# Patient Record
Sex: Female | Born: 1950 | Race: White | Hispanic: No | Smoking: Never smoker
Health system: Southern US, Community
[De-identification: ages and names within clinical notes are randomized; demographics above are authoritative.]

## PROBLEM LIST (undated history)

## (undated) DIAGNOSIS — G919 Hydrocephalus, unspecified: Secondary | ICD-10-CM

## (undated) DIAGNOSIS — R42 Dizziness and giddiness: Secondary | ICD-10-CM

## (undated) DIAGNOSIS — I10 Essential (primary) hypertension: Secondary | ICD-10-CM

## (undated) DIAGNOSIS — R32 Unspecified urinary incontinence: Secondary | ICD-10-CM

## (undated) DIAGNOSIS — F329 Major depressive disorder, single episode, unspecified: Secondary | ICD-10-CM

## (undated) DIAGNOSIS — F32A Depression, unspecified: Secondary | ICD-10-CM

## (undated) DIAGNOSIS — R296 Repeated falls: Secondary | ICD-10-CM

## (undated) DIAGNOSIS — I82409 Acute embolism and thrombosis of unspecified deep veins of unspecified lower extremity: Secondary | ICD-10-CM

## (undated) DIAGNOSIS — H9190 Unspecified hearing loss, unspecified ear: Secondary | ICD-10-CM

## (undated) DIAGNOSIS — R569 Unspecified convulsions: Secondary | ICD-10-CM

## (undated) HISTORY — PX: TUBAL LIGATION: SHX77

## (undated) HISTORY — DX: Dizziness and giddiness: R42

## (undated) HISTORY — PX: OTHER SURGICAL HISTORY: SHX169

## (undated) HISTORY — PX: BRAIN SURGERY: SHX531

## (undated) HISTORY — PX: KNEE SURGERY: SHX244

## (undated) HISTORY — PX: BREAST BIOPSY: SHX20

## (undated) HISTORY — PX: CSF SHUNT: SHX92

## (undated) HISTORY — PX: COCHLEAR IMPLANT: SUR684

---

## 1999-02-02 ENCOUNTER — Emergency Department (HOSPITAL_COMMUNITY): Admission: EM | Admit: 1999-02-02 | Discharge: 1999-02-02 | Payer: Self-pay | Admitting: Emergency Medicine

## 1999-02-02 ENCOUNTER — Encounter: Payer: Self-pay | Admitting: Emergency Medicine

## 1999-02-11 ENCOUNTER — Encounter: Admission: RE | Admit: 1999-02-11 | Discharge: 1999-02-11 | Payer: Self-pay | Admitting: *Deleted

## 1999-02-14 ENCOUNTER — Other Ambulatory Visit: Admission: RE | Admit: 1999-02-14 | Discharge: 1999-02-14 | Payer: Self-pay | Admitting: Internal Medicine

## 2000-04-01 ENCOUNTER — Other Ambulatory Visit: Admission: RE | Admit: 2000-04-01 | Discharge: 2000-04-01 | Payer: Self-pay | Admitting: Obstetrics and Gynecology

## 2000-04-01 ENCOUNTER — Encounter (INDEPENDENT_AMBULATORY_CARE_PROVIDER_SITE_OTHER): Payer: Self-pay | Admitting: Specialist

## 2000-04-13 ENCOUNTER — Encounter: Admission: RE | Admit: 2000-04-13 | Discharge: 2000-04-13 | Payer: Self-pay | Admitting: Neurosurgery

## 2000-04-13 ENCOUNTER — Encounter: Payer: Self-pay | Admitting: Neurosurgery

## 2000-04-14 ENCOUNTER — Ambulatory Visit (HOSPITAL_COMMUNITY): Admission: RE | Admit: 2000-04-14 | Discharge: 2000-04-14 | Payer: Self-pay | Admitting: Obstetrics and Gynecology

## 2000-04-14 ENCOUNTER — Encounter: Payer: Self-pay | Admitting: Obstetrics and Gynecology

## 2000-10-13 ENCOUNTER — Encounter: Payer: Self-pay | Admitting: General Surgery

## 2000-10-14 ENCOUNTER — Encounter (INDEPENDENT_AMBULATORY_CARE_PROVIDER_SITE_OTHER): Payer: Self-pay | Admitting: Specialist

## 2000-10-14 ENCOUNTER — Inpatient Hospital Stay (HOSPITAL_COMMUNITY): Admission: RE | Admit: 2000-10-14 | Discharge: 2000-10-15 | Payer: Self-pay | Admitting: General Surgery

## 2001-07-02 ENCOUNTER — Encounter: Admission: RE | Admit: 2001-07-02 | Discharge: 2001-07-02 | Payer: Self-pay | Admitting: Neurosurgery

## 2001-07-02 ENCOUNTER — Encounter: Payer: Self-pay | Admitting: Neurosurgery

## 2001-12-08 ENCOUNTER — Emergency Department (HOSPITAL_COMMUNITY): Admission: EM | Admit: 2001-12-08 | Discharge: 2001-12-08 | Payer: Self-pay | Admitting: *Deleted

## 2002-02-25 ENCOUNTER — Other Ambulatory Visit: Admission: RE | Admit: 2002-02-25 | Discharge: 2002-02-25 | Payer: Self-pay | Admitting: Obstetrics and Gynecology

## 2005-05-22 ENCOUNTER — Ambulatory Visit: Payer: Self-pay | Admitting: Internal Medicine

## 2005-06-04 ENCOUNTER — Ambulatory Visit: Payer: Self-pay | Admitting: Internal Medicine

## 2005-06-17 ENCOUNTER — Ambulatory Visit: Payer: Self-pay | Admitting: Internal Medicine

## 2005-08-27 ENCOUNTER — Other Ambulatory Visit: Admission: RE | Admit: 2005-08-27 | Discharge: 2005-08-27 | Payer: Self-pay | Admitting: Family Medicine

## 2005-11-09 ENCOUNTER — Emergency Department (HOSPITAL_COMMUNITY): Admission: EM | Admit: 2005-11-09 | Discharge: 2005-11-09 | Payer: Self-pay | Admitting: Emergency Medicine

## 2005-11-17 ENCOUNTER — Ambulatory Visit (HOSPITAL_COMMUNITY): Admission: RE | Admit: 2005-11-17 | Discharge: 2005-11-17 | Payer: Self-pay | Admitting: Obstetrics and Gynecology

## 2005-11-17 ENCOUNTER — Encounter (INDEPENDENT_AMBULATORY_CARE_PROVIDER_SITE_OTHER): Payer: Self-pay | Admitting: *Deleted

## 2005-11-24 ENCOUNTER — Encounter: Admission: RE | Admit: 2005-11-24 | Discharge: 2005-12-26 | Payer: Self-pay | Admitting: Neurosurgery

## 2006-02-11 ENCOUNTER — Encounter: Admission: RE | Admit: 2006-02-11 | Discharge: 2006-05-12 | Payer: Self-pay | Admitting: Neurosurgery

## 2007-09-21 ENCOUNTER — Encounter: Admission: RE | Admit: 2007-09-21 | Discharge: 2007-10-13 | Payer: Self-pay | Admitting: Family Medicine

## 2007-09-28 ENCOUNTER — Other Ambulatory Visit: Admission: RE | Admit: 2007-09-28 | Discharge: 2007-09-28 | Payer: Self-pay | Admitting: Family Medicine

## 2007-11-09 ENCOUNTER — Encounter: Payer: Self-pay | Admitting: Internal Medicine

## 2008-09-20 ENCOUNTER — Encounter: Admission: RE | Admit: 2008-09-20 | Discharge: 2008-11-29 | Payer: Self-pay | Admitting: Neurosurgery

## 2009-03-12 ENCOUNTER — Encounter: Payer: Self-pay | Admitting: Internal Medicine

## 2009-09-15 ENCOUNTER — Inpatient Hospital Stay (HOSPITAL_COMMUNITY): Admission: EM | Admit: 2009-09-15 | Discharge: 2009-09-17 | Payer: Self-pay | Admitting: Emergency Medicine

## 2009-10-16 ENCOUNTER — Encounter (HOSPITAL_BASED_OUTPATIENT_CLINIC_OR_DEPARTMENT_OTHER): Admission: RE | Admit: 2009-10-16 | Discharge: 2009-12-24 | Payer: Self-pay | Admitting: Internal Medicine

## 2009-10-22 ENCOUNTER — Ambulatory Visit: Payer: Self-pay | Admitting: Surgery

## 2009-10-22 ENCOUNTER — Encounter (HOSPITAL_BASED_OUTPATIENT_CLINIC_OR_DEPARTMENT_OTHER): Payer: Self-pay | Admitting: General Surgery

## 2009-10-22 ENCOUNTER — Ambulatory Visit: Admission: RE | Admit: 2009-10-22 | Discharge: 2009-10-22 | Payer: Self-pay | Admitting: General Surgery

## 2010-07-10 ENCOUNTER — Encounter: Admission: RE | Admit: 2010-07-10 | Discharge: 2010-07-10 | Payer: Self-pay | Admitting: Family Medicine

## 2010-07-29 ENCOUNTER — Encounter: Payer: Self-pay | Admitting: Internal Medicine

## 2010-08-12 ENCOUNTER — Emergency Department (HOSPITAL_COMMUNITY): Admission: EM | Admit: 2010-08-12 | Discharge: 2010-08-12 | Payer: Self-pay | Admitting: Emergency Medicine

## 2010-09-23 ENCOUNTER — Encounter: Payer: Self-pay | Admitting: Internal Medicine

## 2010-10-18 NOTE — Letter (Signed)
Summary: Otolaryngology/[facility]  Otolaryngology/[facility]   Imported By: Lester [place] 08/09/2010 11:44:28  _____________________________________________________________________  External Attachment:    Type:   Image     Comment:   External Document

## 2010-10-23 NOTE — Procedures (Signed)
Summary: COLON   Colonoscopy  Procedure date:  06/17/2005  Findings:      Location:  [facility].   Patient Name: Maureen Ortiz, Maureen Ortiz MRN:  Procedure Procedures: Colonoscopy CPT: 04540.  Personnel: Endoscopist: Dora L. Juanda Chance, MD.  Referred By: Laurann Montana, MD.  Exam Location: Exam performed in Outpatient Clinic. Outpatient  Patient Consent: Procedure, Alternatives, Risks and Benefits discussed, consent obtained, from patient. Consent was obtained by the RN.  Indications Symptoms: Constipation Patient has difficulty evacuating, strains with stool passage. Change in bowel habits.  History  Current Medications: Patient is not currently taking Coumadin.  Pre-Exam Physical: Performed Jun 17, 2005. Entire physical exam was normal.  Exam Exam: Extent of exam reached: Cecum, extent intended: Cecum.  The cecum was identified by appendiceal orifice and IC valve. Colon retroflexion performed. Images taken. ASA Classification: I. Tolerance: good.  Monitoring: Pulse and BP monitoring, Oximetry used. Supplemental O2 given.  Colon Prep Used Miralax for colon prep. Prep results: good.  Sedation Meds: Patient assessed and found to be appropriate for moderate (conscious) sedation. Fentanyl 75 mcg. given IV. Versed 7 mg. given IV.  Findings - NORMAL EXAM: Cecum.  - NORMAL EXAM: Rectum. Comments: no evidence of prolapse, no hemorrhoids, structurally normal rectum.   Assessment Normal examination.  Comments: no polyps, no structural abnormALITY OF THE RECTUM Events  Unplanned Interventions: No intervention was required.  Unplanned Events: There were no complications. Plans Patient Education: Patient given standard instructions for: Yearly hemoccult testing recommended. Patient instructed to get routine colonoscopy every 10 years.  Comments: use manual pressure to evacuate stoool, also may use stool softeners, increase exercise to strengthen the pelvic floor,  consider surgical eval for repair of rectocele as a last resort Disposition: After procedure patient sent to recovery. After recovery patient sent home.   This report was created from the original endoscopy report, which was reviewed and signed by the above listed endoscopist.

## 2010-10-31 NOTE — Letter (Signed)
Summary: Cochlear Implant Eval/[facility]  Cochlear Implant [facility]   Imported By: Sherian Rein 10/25/2010 12:19:36  _____________________________________________________________________  External Attachment:    Type:   Image     Comment:   External Document

## 2010-12-01 LAB — BASIC METABOLIC PANEL
BUN: 18 mg/dL (ref 6–23)
GFR calc Af Amer: >60 mL/min (ref >60)
GFR calc non Af Amer: >60 mL/min (ref >60)
Potassium: 3.7 mEq/L (ref 3.5–5.1)
Sodium: 139 mEq/L (ref 135–145)

## 2010-12-01 LAB — CBC
HCT: 36.9 % (ref 36.0–46.0)
Hemoglobin: 12.4 g/dL (ref 12.0–15.0)
MCHC: 33.5 g/dL (ref 30.0–36.0)
MCV: 92.2 fL (ref 78.0–100.0)
Platelets: 231 10*3/uL (ref 150–400)
RBC: 4.01 MIL/uL (ref 3.87–5.11)
WBC: 7.1 10*3/uL (ref 4.0–10.5)

## 2010-12-16 LAB — DIFFERENTIAL
Basophils Relative: 1 % (ref 0–1)
Lymphocytes Relative: 29 % (ref 12–46)
Monocytes Absolute: 0.4 10*3/uL (ref 0.1–1.0)
Monocytes Relative: 6 % (ref 3–12)
Neutro Abs: 3.5 10*3/uL (ref 1.7–7.7)

## 2010-12-16 LAB — CBC
HCT: 38 % (ref 36.0–46.0)
WBC: 5.7 10*3/uL (ref 4.0–10.5)

## 2010-12-16 LAB — BASIC METABOLIC PANEL
BUN: 23 mg/dL (ref 6–23)
GFR calc Af Amer: >60 mL/min (ref >60)
GFR calc non Af Amer: >60 mL/min (ref >60)

## 2011-01-28 ENCOUNTER — Ambulatory Visit: Payer: Medicare Other | Attending: Family Medicine | Admitting: Physical Therapy

## 2011-01-28 DIAGNOSIS — R42 Dizziness and giddiness: Secondary | ICD-10-CM | POA: Insufficient documentation

## 2011-01-28 DIAGNOSIS — R269 Unspecified abnormalities of gait and mobility: Secondary | ICD-10-CM | POA: Insufficient documentation

## 2011-01-28 DIAGNOSIS — IMO0001 Reserved for inherently not codable concepts without codable children: Secondary | ICD-10-CM | POA: Insufficient documentation

## 2011-01-31 NOTE — Op Note (Signed)
Maureen Ortiz, Maureen Ortiz               ACCOUNT NO.:  [id number]   MEDICAL RECORD NO.:  [id number]          PATIENT TYPE:  AMB   LOCATION:  SDC                           FACILITY:  [facility]   PHYSICIAN:  Randye Lobo, M.D.   DATE OF BIRTH:  19-Sep-1950   DATE OF PROCEDURE:  11/17/2005  DATE OF DISCHARGE:                                 OPERATIVE REPORT   PREOPERATIVE DIAGNOSIS:  1.  Postmenopausal bleeding.  2.  Benign endometrial polyp.   POSTOPERATIVE DIAGNOSIS:  1.  Postmenopausal bleeding.  2.  Benign endometrial polyp.   PROCEDURE:  Hysteroscopy, dilation and curettage, endometrial polypectomy.   SURGEON:  Conley Simmonds, M.D.   ANESTHESIA:  MAC, paracervical block with 1% lidocaine.   IV FLUIDS:  1000 mL Ringer's lactate.   ESTIMATED BLOOD LOSS:  Minimal.   URINE OUTPUT:  400 mL by in and out catheterization.   SORBITOL DEFICIT:  50 mL.   COMPLICATIONS:  None.   INDICATIONS FOR PROCEDURE:  The patient is a 60 year old gravida 4 Caucasian  female who presented to her primary care Davontae Prusinski with an episode of  postmenopausal bleeding which occurred in October 2006. The patient had  experienced her last menstrual period in February 2006. An ultrasound was  ordered in December which documented an endometrial stripe of 3 mm along  with a possible fibroid uterus. The ovaries were within normal limits. When  the patient presented for further evaluation and care, an office endometrial  biopsy was performed by me which documented a benign endometrial polyp. Her  last Pap smear was performed in December 2006 and was within normal limits.  A discussion was held with the patient regarding her postmenopausal bleeding  and the benign endometrial polyp and a plan was made to proceed with a  hysteroscopy, D&C along with a polypectomy after risks, benefits, and  alternatives were discussed.   FINDINGS:  Examination under anesthesia revealed a small, mid position,  mobile uterus. No adnexal  masses were appreciated.   Hysteroscopy demonstrated a sessile polyp which measured approximately 1 cm  in diameter and was noted to be at the inferiormost aspect of the posterior  lower uterine segment. There was no evidence of any intracavitary fibroids.  The tubal ostia appeared to be unremarkable. The endometrial lining was  thin. The uterus sounded to 7 cm.   SPECIMENS:  The endometrial polyp and the endometrial curettings were sent  to pathology separately.   PROCEDURE:  The patient was reidentified in the preoperative hold area. She  did receive clindamycin and gentamicin IV for antibiotic prophylaxis. The  patient was brought to the operating room where she was placed in the dorsal  lithotomy position. MAC anesthesia was induced and her vagina and perineum  were then sterilely prepped and draped. She was catheterized of urine. The  examination under anesthesia was performed and the findings were as noted  above. The speculum was placed inside the vagina and a single-tooth  tenaculum was placed on the anterior cervical lip. A paracervical block was  performed in a standard fashion with a total of 10  mL a 1% lidocaine. The  uterus was sounded. The cervix was then sequentially dilated up to a #21  Pratt dilator. The hysteroscope was then inserted through the cervical os  under the continuous infusion of sorbitol solution. The findings were as  noted above. The diagnostic hysteroscope was removed and the serrated  curette was used to remove the sessile endometrial polyp. The specimen was  sent to pathology. The hysteroscope was then reinserted and the uterine  cavity was reevaluated and there was no evidence of any continued polyp  present. The hysteroscope was withdrawn and the remaining quadrants of the  endometrial cavity were then curetted with the serrated curette until we had  a characteristically gritty texture to it. The specimen was sent to  pathology separately.   The  single-tooth tenaculum was removed from the cervix. A ring forceps was  used to create hemostasis along the tenaculum site on the cervix. Hemostasis  was then noted to be good. This concluded the patient's surgery.   The patient was cleansed of any remaining Betadine, awakened, and escorted  to the recovery room in stable and awake condition. There were no  complications to the procedure. All needle, instrument, sponge counts were  correct.      Randye Lobo, M.D.  Electronically Signed     BES/MEDQ  D:  11/17/2005  T:  11/18/2005  Job:  [id number]

## 2011-01-31 NOTE — Op Note (Signed)
[place]  Patient:    Maureen Ortiz, Maureen Ortiz                      MRN: [id number] Proc. Date: 10/14/00 Adm. Date:  [id number] Attending:  Glenna Fellows [place]                           Operative Report  PREOPERATIVE DIAGNOSES: 1. Umbilical hernia. 2. Chronic infected abdominal sinus tract.  POSTOPERATIVE DIAGNOSES: 1. Umbilical hernia. 2. Chronic infected abdominal sinus tract.  PROCEDURES: 1. Repair of umbilical hernia. 2. Excision of abdominal sinus tract.  SURGEON:  Lorne Skeens. Hoxworth, M.D.  ANESTHESIA:  General.  BRIEF HISTORY:  Latrenda Irani is a 60 year old white female with a history of hydrocephalus and multiple ventriculoperitoneal shunts and revisions over the years.  She now presents with a chronic area of purulent drainage and recurrent cellulitis from the left midabdomen at a previous shunt incision. She also has a symptomatic umbilical hernia at the site of a previous laparoscopy incision at the umbilicus.  The wound has failed to respond to conservative management, and we elected to proceed with repair of her umbilical hernia and excision of the abdominal sinus tract.  The nature of the procedures, indications, the risk of bleeding, infection, and recurrent herniation were discussed and understood.  She understands we will not be able to use mesh in the umbilical hernia due to the nearby infection.  She understands all these issues well and is brought to the operating room for these procedures.  DESCRIPTION OF PROCEDURE:  Patient brought to the operating room and placed in the supine position on the operating table, and general endotracheal anesthesia was induced.  She received preoperative antibiotics.  The abdomen was sterilely prepped and draped.  Initially the sinus tract was isolated with drapes and attention turned to the umbilical hernia.  A curvilinear infraumbilical incision was made and dissection carried down  through the subcutaneous tissue.  The umbilical skin was dissected up off of the hernia sac.  The hernia sac was freed from surrounding tissue down to the level of the fascia and excised.  The defect measured about 1.5 cm.  The surrounding fascia was healthy.  Skin and subcutaneous flaps were raised for a short distance on all sides.  The fascia was then closed transversely with interrupted inverting 0 Prolene sutures.  The repair appeared solid, under no significant tension.  The wound was irrigated and hemostasis assured.  The subcutaneous tissue was reapproximated with interrupted 4-0 Monocryl and the skin closed with running subcuticular 4-0 Monocryl and Steri-Strips.  This wound was isolated, and then a probe was able to be passed, as I had done in the office, into the sinus tract in the left midabdomen, which tracked for about 7 or 8 cm inferiorly and medially.  The chronic skin drainage site was excised transversely, and then the sinus tract with the probe in place was cored out of the subcutaneous tissue, working medially and inferiorly.  A second counter incision was made over the tract a few centimeters inferior and medially to allow the tract to be completely excised down around the tip of the probe.  This remained in the subcutaneous tissue.  During the dissection, a portion of a previous ventriculoperitoneal shunt worked its way out through the sinus tract and was apparently the source of the chronic infection.  The tract was excised down past the tip of  the probe as far as it would go, which appeared to remove the tract completely back to healthy tissue in all directions.  The lower incision, after irrigation with antibiotic solution, was closed with interrupted nylon sutures.  The upper incision was partially closed and then the tract completely packed with moist sterile saline gauze. Sponge and needle counts were correct.  Dry sterile dressings were applied and the patient  taken to recovery in good condition. DD:  10/14/00 TD:  10/14/00 Job: [id number]

## 2011-02-04 ENCOUNTER — Ambulatory Visit: Payer: Medicare Other | Admitting: Physical Therapy

## 2011-02-12 ENCOUNTER — Ambulatory Visit: Payer: Medicare Other | Admitting: Rehabilitative and Restorative Service Providers"

## 2011-02-14 ENCOUNTER — Ambulatory Visit: Payer: Medicare Other | Attending: Family Medicine | Admitting: Physical Therapy

## 2011-02-14 DIAGNOSIS — R269 Unspecified abnormalities of gait and mobility: Secondary | ICD-10-CM | POA: Insufficient documentation

## 2011-02-14 DIAGNOSIS — IMO0001 Reserved for inherently not codable concepts without codable children: Secondary | ICD-10-CM | POA: Insufficient documentation

## 2011-02-14 DIAGNOSIS — R42 Dizziness and giddiness: Secondary | ICD-10-CM | POA: Insufficient documentation

## 2011-02-17 ENCOUNTER — Ambulatory Visit: Payer: Medicare Other | Admitting: Physical Therapy

## 2011-02-21 ENCOUNTER — Ambulatory Visit: Payer: Medicare Other | Admitting: Rehabilitative and Restorative Service Providers"

## 2011-02-24 ENCOUNTER — Encounter: Payer: BLUE CROSS/BLUE SHIELD | Admitting: Physical Therapy

## 2011-02-28 ENCOUNTER — Encounter: Payer: BLUE CROSS/BLUE SHIELD | Admitting: Rehabilitative and Restorative Service Providers"

## 2011-03-03 ENCOUNTER — Encounter: Payer: BLUE CROSS/BLUE SHIELD | Admitting: Physical Therapy

## 2011-03-06 ENCOUNTER — Encounter: Payer: BLUE CROSS/BLUE SHIELD | Admitting: Physical Therapy

## 2011-05-24 ENCOUNTER — Emergency Department (HOSPITAL_COMMUNITY)
Admission: EM | Admit: 2011-05-24 | Discharge: 2011-05-24 | Disposition: A | Discharge status: Home / Self Care | Payer: Medicare Other | Attending: Emergency Medicine | Admitting: Emergency Medicine

## 2011-05-24 DIAGNOSIS — I82409 Acute embolism and thrombosis of unspecified deep veins of unspecified lower extremity: Secondary | ICD-10-CM | POA: Insufficient documentation

## 2011-05-24 DIAGNOSIS — M7989 Other specified soft tissue disorders: Secondary | ICD-10-CM

## 2011-05-24 DIAGNOSIS — M79609 Pain in unspecified limb: Secondary | ICD-10-CM

## 2011-05-24 DIAGNOSIS — I8000 Phlebitis and thrombophlebitis of superficial vessels of unspecified lower extremity: Secondary | ICD-10-CM

## 2011-06-05 ENCOUNTER — Emergency Department (HOSPITAL_COMMUNITY): Payer: Medicare Other

## 2011-06-05 ENCOUNTER — Emergency Department (HOSPITAL_COMMUNITY)
Admission: EM | Admit: 2011-06-05 | Discharge: 2011-06-05 | Disposition: A | Discharge status: Home / Self Care | Payer: Medicare Other | Attending: Emergency Medicine | Admitting: Emergency Medicine

## 2011-06-05 DIAGNOSIS — S0100XA Unspecified open wound of scalp, initial encounter: Secondary | ICD-10-CM | POA: Insufficient documentation

## 2011-06-05 DIAGNOSIS — Z7901 Long term (current) use of anticoagulants: Secondary | ICD-10-CM | POA: Insufficient documentation

## 2011-06-05 DIAGNOSIS — W1809XA Striking against other object with subsequent fall, initial encounter: Secondary | ICD-10-CM | POA: Insufficient documentation

## 2011-06-05 DIAGNOSIS — S0990XA Unspecified injury of head, initial encounter: Secondary | ICD-10-CM | POA: Insufficient documentation

## 2011-06-05 LAB — CBC
HCT: 42.5 % (ref 36.0–46.0)
Hemoglobin: 14 g/dL (ref 12.0–15.0)
MCH: 31 pg (ref 26.0–34.0)
MCV: 94 fL (ref 78.0–100.0)
Platelets: 246 10*3/uL (ref 150–400)
RDW: 12.8 % (ref 11.5–15.5)

## 2011-07-07 ENCOUNTER — Emergency Department (HOSPITAL_COMMUNITY): Payer: Medicare Other

## 2011-07-07 ENCOUNTER — Emergency Department (HOSPITAL_COMMUNITY)
Admission: EM | Admit: 2011-07-07 | Discharge: 2011-07-07 | Disposition: A | Discharge status: Home / Self Care | Payer: Medicare Other | Attending: Emergency Medicine | Admitting: Emergency Medicine

## 2011-07-07 DIAGNOSIS — W1809XA Striking against other object with subsequent fall, initial encounter: Secondary | ICD-10-CM | POA: Insufficient documentation

## 2011-07-07 DIAGNOSIS — Z7901 Long term (current) use of anticoagulants: Secondary | ICD-10-CM | POA: Insufficient documentation

## 2011-07-07 DIAGNOSIS — Y92009 Unspecified place in unspecified non-institutional (private) residence as the place of occurrence of the external cause: Secondary | ICD-10-CM | POA: Insufficient documentation

## 2011-07-07 DIAGNOSIS — S0990XA Unspecified injury of head, initial encounter: Secondary | ICD-10-CM | POA: Insufficient documentation

## 2011-07-07 DIAGNOSIS — S0180XA Unspecified open wound of other part of head, initial encounter: Secondary | ICD-10-CM | POA: Insufficient documentation

## 2011-07-07 LAB — BASIC METABOLIC PANEL
BUN: 17 mg/dL (ref 6–23)
Calcium: 9.1 mg/dL (ref 8.4–10.5)
Chloride: 102 mEq/L (ref 96–112)
GFR calc non Af Amer: >90 mL/min (ref >90)
Glucose, Bld: 99 mg/dL (ref 70–99)
Sodium: 138 mEq/L (ref 135–145)

## 2011-07-07 LAB — URINALYSIS, ROUTINE W REFLEX MICROSCOPIC
Hgb urine dipstick: NEGATIVE
Nitrite: NEGATIVE
Protein, ur: NEGATIVE mg/dL

## 2011-07-07 LAB — DIFFERENTIAL
Basophils Absolute: 0 10*3/uL (ref 0.0–0.1)
Lymphocytes Relative: 14 % (ref 12–46)
Neutro Abs: 6.2 10*3/uL (ref 1.7–7.7)

## 2011-07-07 LAB — CBC
Hemoglobin: 10.6 g/dL — ABNORMAL LOW (ref 12.0–15.0)
MCH: 30.8 pg (ref 26.0–34.0)
MCHC: 32.5 g/dL (ref 30.0–36.0)
MCV: 94.8 fL (ref 78.0–100.0)

## 2011-08-15 ENCOUNTER — Emergency Department (HOSPITAL_COMMUNITY)
Admission: EM | Admit: 2011-08-15 | Discharge: 2011-08-15 | Disposition: A | Discharge status: Home / Self Care | Payer: Medicare Other | Attending: Emergency Medicine | Admitting: Emergency Medicine

## 2011-08-15 ENCOUNTER — Emergency Department (HOSPITAL_COMMUNITY): Payer: Medicare Other

## 2011-08-15 ENCOUNTER — Encounter: Payer: Self-pay | Admitting: Adult Health

## 2011-08-15 DIAGNOSIS — I10 Essential (primary) hypertension: Secondary | ICD-10-CM | POA: Insufficient documentation

## 2011-08-15 DIAGNOSIS — Z86718 Personal history of other venous thrombosis and embolism: Secondary | ICD-10-CM | POA: Insufficient documentation

## 2011-08-15 DIAGNOSIS — R296 Repeated falls: Secondary | ICD-10-CM | POA: Insufficient documentation

## 2011-08-15 DIAGNOSIS — S0100XA Unspecified open wound of scalp, initial encounter: Secondary | ICD-10-CM | POA: Insufficient documentation

## 2011-08-15 DIAGNOSIS — G911 Obstructive hydrocephalus: Secondary | ICD-10-CM | POA: Insufficient documentation

## 2011-08-15 DIAGNOSIS — Z9181 History of falling: Secondary | ICD-10-CM | POA: Insufficient documentation

## 2011-08-15 DIAGNOSIS — Z993 Dependence on wheelchair: Secondary | ICD-10-CM | POA: Insufficient documentation

## 2011-08-15 DIAGNOSIS — W19XXXA Unspecified fall, initial encounter: Secondary | ICD-10-CM

## 2011-08-15 DIAGNOSIS — F3289 Other specified depressive episodes: Secondary | ICD-10-CM | POA: Insufficient documentation

## 2011-08-15 DIAGNOSIS — Z982 Presence of cerebrospinal fluid drainage device: Secondary | ICD-10-CM | POA: Insufficient documentation

## 2011-08-15 DIAGNOSIS — F329 Major depressive disorder, single episode, unspecified: Secondary | ICD-10-CM | POA: Insufficient documentation

## 2011-08-15 DIAGNOSIS — H9190 Unspecified hearing loss, unspecified ear: Secondary | ICD-10-CM | POA: Insufficient documentation

## 2011-08-15 HISTORY — DX: Unspecified urinary incontinence: R32

## 2011-08-15 HISTORY — DX: Depression, unspecified: F32.A

## 2011-08-15 HISTORY — DX: Hydrocephalus, unspecified: G91.9

## 2011-08-15 HISTORY — DX: Major depressive disorder, single episode, unspecified: F32.9

## 2011-08-15 HISTORY — DX: Repeated falls: R29.6

## 2011-08-15 HISTORY — DX: Essential (primary) hypertension: I10

## 2011-08-15 HISTORY — DX: Unspecified hearing loss, unspecified ear: H91.90

## 2011-08-15 HISTORY — DX: Acute embolism and thrombosis of unspecified deep veins of unspecified lower extremity: I82.409

## 2011-08-15 HISTORY — DX: Unspecified convulsions: R56.9

## 2011-08-15 LAB — BASIC METABOLIC PANEL
CO2: 28 mEq/L (ref 19–32)
Calcium: 9.9 mg/dL (ref 8.4–10.5)
Chloride: 102 mEq/L (ref 96–112)
Creatinine, Ser: 0.55 mg/dL (ref 0.50–1.10)
GFR calc Af Amer: >90 mL/min (ref >90)
Sodium: 138 mEq/L (ref 135–145)

## 2011-08-15 LAB — CBC
Platelets: 250 10*3/uL (ref 150–400)
RBC: 4.63 MIL/uL (ref 3.87–5.11)
RDW: 13.8 % (ref 11.5–15.5)
WBC: 6.1 10*3/uL (ref 4.0–10.5)

## 2011-08-15 MED ORDER — LIDOCAINE HCL 1 % IJ SOLN
5.0000 mL | Freq: Once | INTRAMUSCULAR | Status: DC
  Filled 2011-08-15: qty 20

## 2011-08-15 NOTE — ED Provider Notes (Signed)
Medical screening examination/treatment/procedure(s) were performed by non-physician practitioner and as supervising physician I was immediately available for consultation/collaboration.   Laray Anger, DO 08/15/11 2006

## 2011-08-15 NOTE — Discharge Planning (Signed)
ED CM spoke with PA, Tiffany about needs for Home health (RN/aide) Cm noted [facility]. Cm spoke with pt and Maureen Ortiz, family member at bedside Initially pt disagreed with Home health services then finally agreed to have RN safety evaluation per [facility] staff. Pt continually stating I don't know what they can do, "I have everything I need". "I fall every day" "No one can stop me from falling. Sometimes I don't know when I will fall" Pt explained to CM she has hydrocephalus progressing condition.  Maureen Ortiz made suggestions for safety changes that can be made in the home but unsure if they will be implemented. CM spoke with Baxter Hire, [facility] coordinator to set up services. PCP is Dr Laurann Montana.

## 2011-08-15 NOTE — ED Notes (Signed)
Fall from standing hit the back of her head unwitnessed. Pt is on coumadin. Approximately on and half inch lac to occiput. Pt is normally confused on month and year per daughter. Following commands and alert to self and situation. Cast to left wrist from previous fall.

## 2011-08-15 NOTE — ED Provider Notes (Signed)
History     CSN: [id number] Arrival date & time: 08/15/2011  1:02 PM   None     Chief Complaint  Patient presents with  . Fall    (Consider location/radiation/quality/duration/timing/severity/associated sxs/prior treatment) Patient is a 60 y.o. female presenting with fall. The history is provided by the patient.  Fall    Pt presents to the ED with complaints of a fall. Pt had a fall in September, October and December. Pt has a walker and a wheel chair at home and lives with her mother, daughter and husband. Pt is hard of hearing, suffers from seizures, hypertension, DVT, depression, hydrocephalu. Pt was born with the hydrocephalus. Pts daughter is with the patient and would like some help at home, however, the pt and the patients husband do not want extra help.  Pt has a lac to back of the head, denies any other injuries. Pt has a broken left wrist from a previous fall.  Pt denies any other complaints aside from the pain to the back of the head.  Past Medical History  Diagnosis Date  . Falls frequently   . Hydrocephalus   . Hard of hearing   . Depression   . Incontinent of urine   . DVT of lower extremity (deep venous thrombosis)   . Hypertension   . Seizures     Past Surgical History  Procedure Date  . Csf shunt     History reviewed. No pertinent family history.  History  Substance Use Topics  . Smoking status: Never Smoker   . Smokeless tobacco: Not on file  . Alcohol Use: Yes    OB History    Grav Para Term Preterm Abortions TAB SAB Ect Mult Living                  Review of Systems  All other systems reviewed and are negative.    Allergies  Codeine  Home Medications   Current Outpatient Rx  Name Route Sig Dispense Refill  . CALCIUM CARBONATE-VITAMIN D 500-200 MG-UNIT PO TABS Oral Take 1 tablet by mouth daily.      Marland Kitchen CALCIUM CARBONATE-VITAMIN D 500-200 MG-UNIT PO TABS Oral Take 1 tablet by mouth daily.      Marland Kitchen FERROUS SULFATE 325 (65 FE) MG PO  TBEC Oral Take 325 mg by mouth daily.      Marland Kitchen FLUOXETINE HCL 40 MG PO CAPS Oral Take 40 mg by mouth daily.      Carma Leaven M PLUS PO TABS Oral Take 1 tablet by mouth daily.      . WARFARIN SODIUM 5 MG PO TABS Oral Take 5 mg by mouth daily.      . ACETAMINOPHEN 500 MG PO TABS Oral Take 1,000 mg by mouth every 6 (six) hours as needed. For pain       BP 144/88  Pulse 88  Temp(Src) 97.6 F (36.4 C) (Oral)  Resp 18  SpO2 97%  Physical Exam  Nursing note and vitals reviewed. Constitutional: She appears well-developed and well-nourished.    HENT:  Head: Normocephalic.       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  Eyes: Conjunctivae are normal. Pupils are equal, round, and reactive to light.  Neck: Trachea normal, normal range of motion and full passive range of motion without pain. Neck supple.  Cardiovascular: Normal rate, regular rhythm and normal pulses.   Pulmonary/Chest: Effort normal and breath sounds normal. Chest wall is not dull to percussion. She exhibits  no tenderness, no crepitus, no edema, no deformity and no retraction.  Abdominal: Soft. Normal appearance and bowel sounds are normal.  Musculoskeletal:       Right shoulder: She exhibits decreased range of motion (pt [place] confined to walker or wheel chair.).  Neurological: She is alert. She has normal strength.  Skin: Skin is warm, dry and intact.  Psychiatric: She has a normal mood and affect. Her speech is normal and behavior is normal. Judgment and thought content normal. Cognition and memory are normal.    ED Course  LACERATION REPAIR Date/Time: 08/15/2011 3:19 PM Performed by: Dorthula Matas Authorized by: Dorthula Matas Consent: Verbal consent obtained. Risks and benefits: risks, benefits and alternatives were discussed Consent given by: patient and guardian Patient understanding: patient states understanding of the procedure being performed Patient consent: the patient's understanding of the procedure  matches consent given Body area: head/neck Location details: scalp Laceration length: 5 cm Foreign bodies: no foreign bodies Tendon involvement: none Nerve involvement: none Vascular damage: no Anesthesia: local infiltration Local anesthetic: lidocaine 1% without epinephrine Anesthetic total: 3 ml Patient sedated: no Amount of cleaning: extensive Skin closure: staples Number of sutures: 6 Patient tolerance: Patient tolerated the procedure well with no immediate complications.   (including critical care time)   Labs Reviewed  CBC  BASIC METABOLIC PANEL   Ct Head Wo Contrast  08/15/2011  *RADIOLOGY REPORT*  Clinical Data: Fall.  Trauma top of head.  Headaches.  History of cochlear prosthesis and hydrocephalus.  CT HEAD WITHOUT CONTRAST  Technique:  Contiguous axial images were obtained from the base of the skull through the vertex without contrast.  Comparison: CT head without contrast in/22/2012.  Findings: The patient is status post suboccipital craniotomy. Multiple metallic clips are present within the posterior fossa. Anterior and posterior left sided ventriculostomy drains are stable.  There is no significant change in ventricular size.  The chronically calcified right frontal extra-axial collection is stable.  A left frontal calvarial defect is stable.  A left-sided cochlear implant is again noted.  The paranasal sinuses and mastoid air cells are clear.  IMPRESSION:  1.  No acute intracranial abnormality or significant interval change. 2.  Stable left-sided encephalomalacia. 3.  Stable ventricular enlargement with a tracheostomy catheter is in place. 4.  Stable calcified extra-axial collection adjacent the right frontal lobe.  The 5.  No acute injury.  Original Report Authenticated By: Jamesetta Orleans. MATTERN, M.D.     No diagnosis found.    MDM  I requested social servces to come speak with patient and offer help services due to Multiple falls. The patient declined any help  from the Child psychotherapist. The daughter is in the room and states her mother always refuses help.       Dorthula Matas, PA 08/15/11 1520

## 2012-03-15 ENCOUNTER — Telehealth: Payer: Self-pay | Admitting: *Deleted

## 2012-03-15 NOTE — Telephone Encounter (Signed)
Spoke with Maureen Ortiz and moved patient to 03/19/12 at 2:30 PM. Per Maureen Ortiz, she does not need an interpreter. Patient reads lips.

## 2012-03-17 ENCOUNTER — Encounter: Payer: Self-pay | Admitting: *Deleted

## 2012-03-19 ENCOUNTER — Other Ambulatory Visit (INDEPENDENT_AMBULATORY_CARE_PROVIDER_SITE_OTHER): Payer: Medicare Other

## 2012-03-19 ENCOUNTER — Ambulatory Visit (INDEPENDENT_AMBULATORY_CARE_PROVIDER_SITE_OTHER): Payer: Medicare Other | Admitting: Internal Medicine

## 2012-03-19 ENCOUNTER — Encounter: Payer: Self-pay | Admitting: Internal Medicine

## 2012-03-19 VITALS — BP 120/74 | HR 80 | Ht 66.0 in | Wt 138.0 lb

## 2012-03-19 DIAGNOSIS — R109 Unspecified abdominal pain: Secondary | ICD-10-CM

## 2012-03-19 DIAGNOSIS — R1011 Right upper quadrant pain: Secondary | ICD-10-CM

## 2012-03-19 LAB — HEPATIC FUNCTION PANEL
ALT: 15 U/L (ref 0–35)
AST: 15 U/L (ref 0–37)
Bilirubin, Direct: 0.1 mg/dL (ref 0.0–0.3)
Total Bilirubin: 0.4 mg/dL (ref 0.3–1.2)

## 2012-03-19 LAB — AMYLASE: Amylase: 43 U/L (ref 27–131)

## 2012-03-19 LAB — LIPASE: Lipase: 29 U/L (ref 11.0–59.0)

## 2012-03-19 NOTE — Patient Instructions (Addendum)
Your physician has requested that you go to the basement for the following lab work before leaving today: Hepatic Panel, Amylase, Lipase You have been scheduled for an abdominal ultrasound and HIDA scan at [facility] (1st floor of hospital) on 03/31/12 at 8:30 am. Please arrive 15 minutes prior to your appointment for registration. Make certain not to have anything to eat or drink 6 hours prior to your appointment. Should you need to reschedule your appointment, please contact radiology at [phone]. CC:Dr Laurann Montana

## 2012-03-19 NOTE — Progress Notes (Signed)
Maureen Ortiz 07-Mar-1951 MRN [id number]   History of Present Illness:  This is a 61 year old white female with a history of hydrocephalus and hearing impairment who had an isolated episode of right upper quadrant abdominal pain which started after supper 2 weeks ago and felt like indigestion and pain. She denies jaundice or fever. There is no family history of gallbladder disease. An upper abdominal ultrasound in October 2011 showed a 3.9 mm common bile duct. The head of the pancreas was obscured by bowel gas but appeared normal. There were no stones. She denies having heartburn or indigestion. She was given Prilosec following the episode. Her bowel habits have been somewhat loose. She had a screening colonoscopy in October 2006 was a normal exam. Her weight has been stable. She denies any rectal bleeding.   Past Medical History  Diagnosis Date  . Falls frequently   . Hydrocephalus   . Hard of hearing   . Depression   . Incontinent of urine   . DVT of lower extremity (deep venous thrombosis)   . Hypertension   . Seizures   . Vertigo    Past Surgical History  Procedure Date  . Csf shunt   . Breast biopsy     benign x 2  . Brain surgery     multiple  . Paraovarian cyst removal   . Tubal ligation   . Knee surgery     x 3  . Cochlear implant     left    reports that she has never smoked. She has never used smokeless tobacco. She reports that she drinks alcohol. She reports that she does not use illicit drugs. family history includes Heart disease in her father and mother and Stroke in her mother.  There is no history of Colon cancer. Allergies  Allergen Reactions  . Codeine Nausea And Vomiting        Review of Systems: Negative for reflux symptoms  The remainder of the 10 point ROS is negative except as outlined in H&P   Physical Exam: General appearance  Well developed, in no distress. Hard of hearing. In a wheelchair Eyes- non icteric. HEENT nontraumatic,  normocephalic. Mouth no lesions, tongue papillated, no cheilosis. Neck supple without adenopathy, thyroid not enlarged, no carotid bruits, no JVD. Lungs Clear to auscultation bilaterally. Cor normal S1, normal S2, regular rhythm, no murmur,  quiet precordium. Abdomen: Soft nontender abdomen with mild discomfort in right upper quadrant overlying liver. Liver not enlarged. Bowel sounds are active. There is no distention. Rectal: Soft Hemoccult negative stool. Extremities no pedal edema. Deformities of lower extremities. Skin no lesions. Neurological alert and oriented x 3. Status post cochlear implant, hard of hearing but otherwise normal. Psychological normal mood and affect.  Assessment and Plan:  Problem #1 Episodes of severe right upper quadrant abdominal pain suggestive of biliary colic. It has now resolved but there is residual right upper quadrant tenderness. We will obtain liver function tests, amylase and lipase. We will repeat an upper abdominal ultrasound and proceed with a HIDA scan with CCK to assess the function of the gallbladder. She is to stay on a low-fat diet and take Prilosec as needed. If her biliary studies are negative, then I would consider an upper endoscopy.  Problem #2 Colorectal screening. Her last colonoscopy was in 2006. Her next colonoscopy will be due in 2016.   03/19/2012 Lina Sar

## 2012-03-20 ENCOUNTER — Encounter: Payer: Self-pay | Admitting: Internal Medicine

## 2012-03-22 ENCOUNTER — Ambulatory Visit
Admission: RE | Admit: 2012-03-22 | Discharge: 2012-03-22 | Disposition: A | Discharge status: Home / Self Care | Payer: Medicare Other | Source: Ambulatory Visit | Attending: Family Medicine | Admitting: Family Medicine

## 2012-03-22 ENCOUNTER — Other Ambulatory Visit: Payer: Self-pay | Admitting: Family Medicine

## 2012-03-22 DIAGNOSIS — M25552 Pain in left hip: Secondary | ICD-10-CM

## 2012-03-31 ENCOUNTER — Encounter (HOSPITAL_COMMUNITY)
Admission: RE | Admit: 2012-03-31 | Discharge: 2012-03-31 | Disposition: A | Discharge status: Home / Self Care | Payer: Medicare Other | Source: Ambulatory Visit | Attending: Internal Medicine | Admitting: Internal Medicine

## 2012-03-31 ENCOUNTER — Encounter (HOSPITAL_COMMUNITY): Payer: Self-pay

## 2012-03-31 DIAGNOSIS — R109 Unspecified abdominal pain: Secondary | ICD-10-CM | POA: Insufficient documentation

## 2012-03-31 MED ORDER — TECHNETIUM TC 99M MEBROFENIN IV KIT
5.5000 | PACK | Freq: Once | INTRAVENOUS | Status: AC | PRN
  Administered 2012-03-31: 5.5 via INTRAVENOUS

## 2012-03-31 MED ORDER — SINCALIDE 5 MCG IJ SOLR: 0.0200 ug/kg | Freq: Once | INTRAMUSCULAR | Status: DC

## 2012-05-18 ENCOUNTER — Ambulatory Visit: Payer: BLUE CROSS/BLUE SHIELD | Admitting: Internal Medicine

## 2012-05-18 ENCOUNTER — Telehealth: Payer: Self-pay | Admitting: Internal Medicine

## 2012-05-18 NOTE — Telephone Encounter (Signed)
Spoke with patient and her father. Gave them results from July HIDA. Patient's father states he thought she would have an OV again. Explained that she had her new patient OV in July and this appointment was cancelled because her PCP wanted her seen in July. Patient states she is much better and does not have the pain like before. Patient does not want to schedule EGD at this time.

## 2012-05-25 ENCOUNTER — Other Ambulatory Visit: Payer: Self-pay | Admitting: Family Medicine

## 2012-06-09 ENCOUNTER — Encounter (HOSPITAL_COMMUNITY)
Admission: RE | Admit: 2012-06-09 | Discharge: 2012-06-09 | Disposition: A | Discharge status: Home / Self Care | Payer: Medicare Other | Source: Ambulatory Visit | Attending: Family Medicine | Admitting: Family Medicine

## 2012-06-09 DIAGNOSIS — M810 Age-related osteoporosis without current pathological fracture: Secondary | ICD-10-CM | POA: Insufficient documentation

## 2012-06-09 MED ORDER — ZOLEDRONIC ACID 5 MG/100ML IV SOLN
5.0000 mg | Freq: Once | INTRAVENOUS | Status: AC
  Administered 2012-06-09: 5 mg via INTRAVENOUS
  Filled 2012-06-09: qty 100

## 2012-06-09 MED ORDER — SODIUM CHLORIDE 0.9 % IV SOLN
INTRAVENOUS | Status: DC
  Administered 2012-06-09: 09:00:00 via INTRAVENOUS

## 2012-06-09 NOTE — Progress Notes (Signed)
Spoke with Dr Cliffton Asters about pt's BP and OK to proceed with RECLAST infusion

## 2012-06-09 NOTE — Progress Notes (Addendum)
Pt here today for her RECLAST infusion Pt expressing some concerns about her elevated BP. States "this is not typical for me"  Denies any other c/o related to this Placed call to Dr Lucilla Lame office to report this

## 2012-06-09 NOTE — Progress Notes (Signed)
BP continues to remain elevated and pt voices no c/o encouraged pt to follow-up with Dr Cliffton Asters about this and pt verbalized understanding

## 2012-07-05 ENCOUNTER — Ambulatory Visit
Admission: RE | Admit: 2012-07-05 | Discharge: 2012-07-05 | Disposition: A | Discharge status: Home / Self Care | Payer: Medicare Other | Source: Ambulatory Visit | Attending: Family Medicine | Admitting: Family Medicine

## 2012-07-05 ENCOUNTER — Other Ambulatory Visit: Payer: Self-pay | Admitting: Family Medicine

## 2012-07-05 DIAGNOSIS — R609 Edema, unspecified: Secondary | ICD-10-CM

## 2013-03-09 ENCOUNTER — Emergency Department (HOSPITAL_COMMUNITY)
Admission: EM | Admit: 2013-03-09 | Discharge: 2013-03-09 | Disposition: A | Discharge status: Home / Self Care | Payer: Medicare Other | Attending: Emergency Medicine | Admitting: Emergency Medicine

## 2013-03-09 ENCOUNTER — Encounter (HOSPITAL_COMMUNITY): Payer: Self-pay | Admitting: Emergency Medicine

## 2013-03-09 ENCOUNTER — Emergency Department (HOSPITAL_COMMUNITY): Payer: Medicare Other

## 2013-03-09 DIAGNOSIS — Z86718 Personal history of other venous thrombosis and embolism: Secondary | ICD-10-CM | POA: Insufficient documentation

## 2013-03-09 DIAGNOSIS — Z8669 Personal history of other diseases of the nervous system and sense organs: Secondary | ICD-10-CM | POA: Insufficient documentation

## 2013-03-09 DIAGNOSIS — F3289 Other specified depressive episodes: Secondary | ICD-10-CM | POA: Insufficient documentation

## 2013-03-09 DIAGNOSIS — S0990XA Unspecified injury of head, initial encounter: Secondary | ICD-10-CM | POA: Insufficient documentation

## 2013-03-09 DIAGNOSIS — W19XXXA Unspecified fall, initial encounter: Secondary | ICD-10-CM | POA: Insufficient documentation

## 2013-03-09 DIAGNOSIS — Z87448 Personal history of other diseases of urinary system: Secondary | ICD-10-CM | POA: Insufficient documentation

## 2013-03-09 DIAGNOSIS — G911 Obstructive hydrocephalus: Secondary | ICD-10-CM | POA: Insufficient documentation

## 2013-03-09 DIAGNOSIS — F329 Major depressive disorder, single episode, unspecified: Secondary | ICD-10-CM | POA: Insufficient documentation

## 2013-03-09 DIAGNOSIS — R42 Dizziness and giddiness: Secondary | ICD-10-CM | POA: Insufficient documentation

## 2013-03-09 DIAGNOSIS — Y929 Unspecified place or not applicable: Secondary | ICD-10-CM | POA: Insufficient documentation

## 2013-03-09 DIAGNOSIS — Y939 Activity, unspecified: Secondary | ICD-10-CM | POA: Insufficient documentation

## 2013-03-09 DIAGNOSIS — Z9181 History of falling: Secondary | ICD-10-CM | POA: Insufficient documentation

## 2013-03-09 DIAGNOSIS — G919 Hydrocephalus, unspecified: Secondary | ICD-10-CM

## 2013-03-09 DIAGNOSIS — H9190 Unspecified hearing loss, unspecified ear: Secondary | ICD-10-CM | POA: Insufficient documentation

## 2013-03-09 DIAGNOSIS — I10 Essential (primary) hypertension: Secondary | ICD-10-CM | POA: Insufficient documentation

## 2013-03-09 DIAGNOSIS — Z79899 Other long term (current) drug therapy: Secondary | ICD-10-CM | POA: Insufficient documentation

## 2013-03-09 LAB — POCT I-STAT, CHEM 8
Calcium, Ion: 1.22 mmol/L (ref 1.13–1.30)
Creatinine, Ser: 0.7 mg/dL (ref 0.50–1.10)
Hemoglobin: 15.3 g/dL — ABNORMAL HIGH (ref 12.0–15.0)
Sodium: 144 mEq/L (ref 135–145)
TCO2: 28 mmol/L (ref 0–100)

## 2013-03-09 MED ORDER — IOHEXOL 300 MG/ML  SOLN
75.0000 mL | Freq: Once | INTRAMUSCULAR | Status: AC | PRN
  Administered 2013-03-09: 75 mL via INTRAVENOUS

## 2013-03-09 NOTE — ED Notes (Signed)
Pt c/o fall on Monday hitting right side of head; pt denies LOC; pt with hx of hydrocephaly; pt sts increased trouble thinking clearly; pt has baseline problem with finding words but worse today; pt c/o some dizziness

## 2013-03-09 NOTE — ED Notes (Signed)
Discharge instructions reviewed with pt's family. Pt's family verbalized understanding.

## 2013-03-09 NOTE — ED Provider Notes (Signed)
History    CSN: [id number] Arrival date & time 03/09/13  1159  First MD Initiated Contact with Patient 03/09/13 1426     Chief Complaint  Patient presents with  . Fall  . Head Injury   (Consider location/radiation/quality/duration/timing/severity/associated sxs/prior Treatment) HPI  KAYBREE WILLIAMS is a 62 y.o.female with a significant PMH of frequent falls, hydrocephalus, partial deafness, urine incontinence, depression, seizures, hypertension  presents to the ER with complaints of fall with head injury a few days ago. Family members are with patient and says she falls on a daily basis and that she is acting normal. The patient wanted to come get checked out because she feels "funny",  Feels as though she is not thinking as clear as normal. She has a ventricular shunt that ends in her lower right abdomen. Denies loc or increased weakness.    Past Medical History  Diagnosis Date  . Falls frequently   . Hydrocephalus   . Hard of hearing   . Depression   . Incontinent of urine   . DVT of lower extremity (deep venous thrombosis)   . Hypertension   . Seizures   . Vertigo    Past Surgical History  Procedure Laterality Date  . Csf shunt    . Breast biopsy      benign x 2  . Brain surgery      multiple  . Paraovarian cyst removal    . Tubal ligation    . Knee surgery      x 3  . Cochlear implant      left   Family History  Problem Relation Age of Onset  . Stroke Mother   . Heart disease Father   . Heart disease Mother   . Colon cancer Neg Hx    History  Substance Use Topics  . Smoking status: Never Smoker   . Smokeless tobacco: Never Used  . Alcohol Use: Yes     Comment: occasionally   OB History   Grav Para Term Preterm Abortions TAB SAB Ect Mult Living                 Review of Systems  All other systems reviewed and are negative.  unless otherwise noted in the HPI  Allergies  Codeine  Home Medications   Current Outpatient Rx  Name  Route  Sig   Dispense  Refill  . acetaminophen (TYLENOL) 500 MG tablet   Oral   Take 1,000 mg by mouth every 6 (six) hours as needed. For pain          . calcium-vitamin D (OSCAL WITH D) 500-200 MG-UNIT per tablet   Oral   Take 1 tablet by mouth daily.           . ferrous sulfate 325 (65 FE) MG EC tablet   Oral   Take 325 mg by mouth daily.           Marland Kitchen FLUoxetine (PROZAC) 20 MG capsule   Oral   Take 20 mg by mouth daily.         Marland Kitchen FLUoxetine (PROZAC) 40 MG capsule   Oral   Take 40 mg by mouth daily.           Marland Kitchen losartan (COZAAR) 50 MG tablet   Oral   Take 50 mg by mouth daily.         . mirabegron ER (MYRBETRIQ) 50 MG TB24   Oral   Take 50 mg by mouth  daily.         . Multiple Vitamins-Minerals (MULTIVITAMINS THER. W/MINERALS) TABS   Oral   Take 1 tablet by mouth daily.           Marland Kitchen VITAMIN D, CHOLECALCIFEROL, PO   Oral   Take 1 tablet by mouth daily.          BP 166/95  Pulse 73  Temp(Src) 98.7 F (37.1 C) (Oral)  Resp 20  SpO2 99% Physical Exam  Nursing note and vitals reviewed. Constitutional: She is oriented to person, place, and time. She appears well-developed and well-nourished. No distress.  HENT:  Head: Normocephalic. Head is with contusion.  Cochlear implants bilaterally. No obvious contusions or depressions of skull. No lacerations  Eyes: Pupils are equal, round, and reactive to light.  Neck: Normal range of motion. Neck supple. Muscular tenderness present. No spinous process tenderness present. Normal range of motion present.  Cardiovascular: Normal rate and regular rhythm.   Pulmonary/Chest: Effort normal.  Abdominal: Soft.  Neurological: She is alert and oriented to person, place, and time.  Pt is not ambulatory. Physiologic bilateral grip strength.  Skin: Skin is warm and dry.    ED Course  Procedures (including critical care time) Labs Reviewed  POCT I-STAT, CHEM 8 - Abnormal; Notable for the following:    Hemoglobin 15.3 (*)     All other components within normal limits   Dg Skull 1-3 Views  03/09/2013   *RADIOLOGY REPORT*  Clinical Data: 62 year old female with shunt placement.  History of multiple brain surgery.  SKULL - 1-3 VIEW  Comparison: Head CT without contrast 1315 hours the same day and earlier.  Findings: Multi focal craniotomy sequelae.  Coarsely calcified right superior and lateral convexity extra-axial collection. Numerous surgical clips.  Left cochlear implant.  Two separate ventriculostomy shunt re-identified, both from the left side approach.  Two separate shunt tubing then traversed the neck towards the left chest.  Some segments of both are radiolucent.  No definite kinking or discontinuity.  IMPRESSION: 1.  Stable radiographic appearance of two ventriculostomy shunts. No adverse features identified. 2.  Chronic and postoperative findings.   Original Report Authenticated By: Erskine Speed, M.D.   Dg Chest 1 View  03/09/2013   *RADIOLOGY REPORT*  Clinical Data: Fall.  Shunt series.  CHEST - 1 VIEW  Comparison: Thoracic spine radiographs 08/19/2007.  Findings: There are two sections of shunt tubing projects over the chest.  At the thoracic inlet, the lateral shunt tubing appears contiguous and has a metal connector at the level of the AP window. There is no break or discontinuity in this shunt tubing.  The other shunt tubing is more medially at the thoracic inlet and appears to have calcification along the tract, suggesting older tubing.  This is poorly seen as it crosses over the heart and the continuity cannot be verified.  Chronic angulation in this medial shunt tubing is present at the thoracic inlet as well.  IMPRESSION: Two sections shunt tubing projects over the chest.  One of the sections appears newer and is contiguous.  The other tubing appears partially calcified and is probably older with areas of discontinuity.   Original Report Authenticated By: Andreas Newport, M.D.   Dg Abd 1 View  03/09/2013    *RADIOLOGY REPORT*  Clinical Data: Larey Seat several days ago, history of VP shunt  ABDOMEN - 1 VIEW  Comparison: None.  Findings: There does appear to be discontinuity of the VP shunt within the left abdomen.  Conceivably this fragment could be from a prior shunt and be retained, but discontinuity of the present shunt cannot be excluded.  There is some kinking of the more proximal aspect of this shunt tubing within the left upper quadrant as well. An additional thin linear opacity extends from the right lower chest curving into the left abdomen and extending into the left lower pelvis of questionable significance.  The bowel gas pattern is nonspecific.  The bones are osteopenic.  There appears to be a compression deformity of T12 vertebral body most likely old.  IMPRESSION:  There is a tubing fragment in the left abdomen which could represent discontinuity of the VP shunt.  This could represent a remnant from prior shunt however.  Clinical correlation is recommended.   Original Report Authenticated By: Dwyane Dee, M.D.   Ct Head Wo Contrast (only If Suspected Head Trauma And/or Pt Is On Anticoagulant)  03/09/2013   *RADIOLOGY REPORT*  Clinical Data: Fall, with recent difficulty finding words. Dizziness.  The patient hit right side of head 2 days ago without loss of consciousness.  CT HEAD WITHOUT CONTRAST  Technique:  Contiguous axial images were obtained from the base of the skull through the vertex without contrast.  Comparison: 08/15/2011.  Findings: Multiple ventriculoperitoneal shunt catheters are in place from the left for treatment of hydrocephalus.  The ventricles are enlarged but are not significantly increased or decreased in size compared to 2012.  The calvarium is deformed as a sequela of multiple prior cranial surgeries.  There is a large calcified extra-axial fluid collection, likely subdural hematoma, over the right convexity which causes chronic mass effect.  The left hemisphere is characterized by  significant atrophy related to probable remote infarction.  is no visible acute intracranial hemorrhage or midline shift. There is no visible acute infarction or shearing injury.  There is no acute skull fracture.  Paranasal sinuses and mastoids are unremarkable.The left cochlear implant is in good position.  IMPRESSION: Chronic changes as described.  No evidence for skull fracture or acute intracranial hemorrhage.  Similar appearance to 2012.   Original Report Authenticated By: Davonna Belling, M.D.   Ct Soft Tissue Neck W Contrast  03/09/2013   *RADIOLOGY REPORT*  Clinical Data: Larey Seat a few days ago. Per discussion with Dr. Manus Gunning, no neck tenderness.  Question inflammation along the course of the left ventriculoperitoneal shunt?  CT NECK WITH CONTRAST  Technique:  Multidetector CT imaging of the neck was performed with intravenous contrast.  Contrast: 75mL OMNIPAQUE IOHEXOL 300 MG/ML  SOLN  Comparison: Head CT same date.  Findings: No soft tissue inflammatory process is seen along the visualized course of the left ventriculoperitoneal shunt catheters.  Cochlea implant on the left.  Vertebral arteries are patent bilaterally with significant calcification at the foramen magnum level and prominent narrowing greater on the right.  No significant stenosis or abnormality of the internal carotid arteries.  Remote T3 spinous process fracture.  No obvious acute cervical fracture.  Cervical spondylotic changes most notable C5-6 and C6-7. No abnormal prevertebral soft tissue swelling.  Prominent epidural veins most notable upper cervical spine region. Etiology/significance indeterminate.  Prior craniotomy and upper cervical spine surgery with metallic artifact from surgical clips.  Intracranial structures better evaluated on recent head CT.  Please see report from such.  Calcifications left palatine tonsil consistent with prior inflammation.  No primary neck mass or adenopathy.  Lung apices clear.  IMPRESSION: No soft tissue  inflammatory process is seen along the visualized course of  the left ventriculoperitoneal shunt catheters.  Please see above for additional findings.   Original Report Authenticated By: Lacy Duverney, M.D.   1. Hydrocephalus   2. Head injury, initial encounter     MDM  Head CT is at baseline for patient.  Dr. Manus Gunning saw patient as well and recommends skull and acute abdominal series to evaluate ventricular shunt. Also, neck soft tissue CT because of tenderness to right side.  Patient and family hopeful pt will get to go home today and not be admitted.  5:34 pm- on call neurosurgeon with [facility] paged to Dr. Christene Slates phone  6:44pm- labs and images have come back showing no acute findings that require intervention. Neurosurg at [facility] consulted and they feel comfortable with [place] sending pt home.  63 y.o.Rulon Abide Melka's evaluation in the Emergency Department is complete. It has been determined that no acute conditions requiring further emergency intervention are present at this time. The patient/guardian have been advised of the diagnosis and plan. We have discussed signs and symptoms that warrant return to the ED, such as changes or worsening in symptoms.  Vital signs are stable at discharge. Filed Vitals:   03/09/13 1745  BP: 166/95  Pulse: 73  Temp:   Resp:     Patient/guardian has voiced understanding and agreed to follow-up with the PCP or specialist.   Dorthula Matas, PA-C 03/09/13 1845

## 2013-03-09 NOTE — ED Provider Notes (Signed)
Medical screening examination/treatment/procedure(s) were conducted as a shared visit with non-physician practitioner(s) and myself.  I personally evaluated the patient during the encounter  Hx hydrocephalus, last shunt revision 2002, presenting with feeling "off" since a fall a few days ago.  Acting normally per family. No focal deficits. No fevcr. No tenderness along shunt.  CN 2-12 intact, no ataxia on finger to nose, no nystagmus, 5/5 strength throughout, no pronator drift, Romberg negative  Family states has known shunt piece in abdomen. D/w Dr. Lorenso Courier at [facility], on call for Dr. Angelyn Punt.   Glynn Octave, MD 03/09/13 2217

## 2013-08-08 ENCOUNTER — Emergency Department (HOSPITAL_COMMUNITY)
Admission: EM | Admit: 2013-08-08 | Discharge: 2013-08-08 | Disposition: A | Discharge status: Home / Self Care | Payer: Medicare Other | Attending: Emergency Medicine | Admitting: Emergency Medicine

## 2013-08-08 ENCOUNTER — Encounter (HOSPITAL_COMMUNITY): Payer: Self-pay | Admitting: Emergency Medicine

## 2013-08-08 ENCOUNTER — Emergency Department (HOSPITAL_COMMUNITY): Payer: Medicare Other

## 2013-08-08 DIAGNOSIS — S0181XA Laceration without foreign body of other part of head, initial encounter: Secondary | ICD-10-CM

## 2013-08-08 DIAGNOSIS — F3289 Other specified depressive episodes: Secondary | ICD-10-CM | POA: Insufficient documentation

## 2013-08-08 DIAGNOSIS — I10 Essential (primary) hypertension: Secondary | ICD-10-CM | POA: Insufficient documentation

## 2013-08-08 DIAGNOSIS — Z86718 Personal history of other venous thrombosis and embolism: Secondary | ICD-10-CM | POA: Insufficient documentation

## 2013-08-08 DIAGNOSIS — W19XXXA Unspecified fall, initial encounter: Secondary | ICD-10-CM

## 2013-08-08 DIAGNOSIS — Y9389 Activity, other specified: Secondary | ICD-10-CM | POA: Insufficient documentation

## 2013-08-08 DIAGNOSIS — S0990XA Unspecified injury of head, initial encounter: Secondary | ICD-10-CM

## 2013-08-08 DIAGNOSIS — Z9181 History of falling: Secondary | ICD-10-CM | POA: Insufficient documentation

## 2013-08-08 DIAGNOSIS — Z982 Presence of cerebrospinal fluid drainage device: Secondary | ICD-10-CM | POA: Insufficient documentation

## 2013-08-08 DIAGNOSIS — F329 Major depressive disorder, single episode, unspecified: Secondary | ICD-10-CM | POA: Insufficient documentation

## 2013-08-08 DIAGNOSIS — Z9889 Other specified postprocedural states: Secondary | ICD-10-CM | POA: Insufficient documentation

## 2013-08-08 DIAGNOSIS — Y929 Unspecified place or not applicable: Secondary | ICD-10-CM | POA: Insufficient documentation

## 2013-08-08 DIAGNOSIS — S0180XA Unspecified open wound of other part of head, initial encounter: Secondary | ICD-10-CM | POA: Insufficient documentation

## 2013-08-08 DIAGNOSIS — Z8669 Personal history of other diseases of the nervous system and sense organs: Secondary | ICD-10-CM | POA: Insufficient documentation

## 2013-08-08 DIAGNOSIS — Z87448 Personal history of other diseases of urinary system: Secondary | ICD-10-CM | POA: Insufficient documentation

## 2013-08-08 DIAGNOSIS — W1809XA Striking against other object with subsequent fall, initial encounter: Secondary | ICD-10-CM | POA: Insufficient documentation

## 2013-08-08 NOTE — ED Notes (Addendum)
Pt to department via EMS- pt reports that she was attempting to sit down and misjudged the seat. States that she lost her balance and hit her head on the countertop and then the floor. Denies any back pain, LOC or headache. Bp- 150/84 HR-100. CBg-137

## 2013-08-08 NOTE — ED Notes (Signed)
Pt's wound and hands cleaned

## 2013-08-08 NOTE — ED Provider Notes (Signed)
CSN: [id number]     Arrival date & time 08/08/13  1623 History   First MD Initiated Contact with Patient 08/08/13 1624     Chief Complaint  Patient presents with  . Fall  . Head Laceration   (Consider location/radiation/quality/duration/timing/severity/associated sxs/prior Treatment) Patient is a 62 y.o. female presenting with fall and scalp laceration.  Fall  Head Laceration   Patient presents to the emergency department following a fall that occurred just prior to arrival.  Patient, states she was leaning forward to put something away, when she fell forward, striking her head against the table and floor  Patient, states she did not lose consciousness.  Patient, states, that she's not having a headache, blurred vision, weakness, numbness, dizziness, nausea, vomiting, or syncope.  Patient, states, that she didn't take any medications prior to arrival Past Medical History  Diagnosis Date  . Falls frequently   . Hydrocephalus   . Hard of hearing   . Depression   . Incontinent of urine   . DVT of lower extremity (deep venous thrombosis)   . Hypertension   . Seizures   . Vertigo    Past Surgical History  Procedure Laterality Date  . Csf shunt    . Breast biopsy      benign x 2  . Brain surgery      multiple  . Paraovarian cyst removal    . Tubal ligation    . Knee surgery      x 3  . Cochlear implant      left   Family History  Problem Relation Age of Onset  . Stroke Mother   . Heart disease Father   . Heart disease Mother   . Colon cancer Neg Hx    History  Substance Use Topics  . Smoking status: Never Smoker   . Smokeless tobacco: Never Used  . Alcohol Use: Yes     Comment: occasionally   OB History   Grav Para Term Preterm Abortions TAB SAB Ect Mult Living                 Review of Systems All other systems negative except as documented in the HPI. All pertinent positives and negatives as reviewed in the HPI. Allergies  Codeine  Home Medications    Current Outpatient Rx  Name  Route  Sig  Dispense  Refill  . calcium-vitamin D (OSCAL WITH D) 500-200 MG-UNIT per tablet   Oral   Take 1 tablet by mouth daily.           . cetirizine (ZYRTEC) 10 MG tablet   Oral   Take 10 mg by mouth daily as needed for allergies.         . cholecalciferol (VITAMIN D) 1000 UNITS tablet   Oral   Take 1,000 Units by mouth daily.         . ferrous sulfate 325 (65 FE) MG EC tablet   Oral   Take 325 mg by mouth daily.           Marland Kitchen FLUoxetine (PROZAC) 20 MG capsule   Oral   Take 20 mg by mouth daily.         Marland Kitchen losartan (COZAAR) 50 MG tablet   Oral   Take 50 mg by mouth daily.         . mirabegron ER (MYRBETRIQ) 50 MG TB24   Oral   Take 50 mg by mouth daily.         Marland Kitchen  Multiple Vitamins-Minerals (MULTIVITAMINS THER. W/MINERALS) TABS   Oral   Take 1 tablet by mouth daily.            BP 135/81  Pulse 90  Temp(Src) 97.9 F (36.6 C) (Oral)  Resp 18  SpO2 96% Physical Exam  Constitutional: She is oriented to person, place, and time. She appears well-developed and well-nourished. No distress.  HENT:  Head: Normocephalic. Head is with laceration.    Right Ear: Tympanic membrane normal. No hemotympanum.  Left Ear: No hemotympanum.  Nose: Nose normal.  Mouth/Throat: Uvula is midline and oropharynx is clear and moist.  Eyes: Pupils are equal, round, and reactive to light.  Neck: Normal range of motion. Neck supple.  Cardiovascular: Normal rate, regular rhythm and normal heart sounds.  Exam reveals no gallop and no friction rub.   No murmur heard. Pulmonary/Chest: Effort normal and breath sounds normal.  Neurological: She is alert and oriented to person, place, and time. She exhibits normal muscle tone.    ED Course  Procedures (including critical care time) Labs Review Labs Reviewed - No data to display Imaging Review Ct Head Wo Contrast  08/08/2013   CLINICAL DATA:  Fall with head injury.  EXAM: CT HEAD WITHOUT  CONTRAST  TECHNIQUE: Contiguous axial images were obtained from the base of the skull through the vertex without intravenous contrast.  COMPARISON:  03/09/2013.  FINDINGS: Streak artifact from metal along the margins of the suboccipital craniectomy noted. Dense vertebral artery calcification is present. There is also extensive streak artifact related to left cochlear implant. Left frontal and parietal VP shunts noted.  Stable dense calcification along the left falx with a stable large mixed density calcified extra-axial fluid collection in the right frontoparietal region, similar to before. Small craniectomy sites noted bilaterally. 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  There is fat density tracking vertically along the falx, such is a chronically ruptured dermoid is difficult to exclude.  Ex vacuo ventricular prominence is similar to prior I do not observe a new stroke or a new mass lesion. No extra-axial fluid collection is observed compared to prior. No intracranial hemorrhage, new mass lesion, or acute CVA.  IMPRESSION: 1. Extensive chronic findings as noted above, without acute abnormality observed.   Electronically Signed   By: Herbie Baltimore M.D.   On: 08/08/2013 18:35    Patient is advised to the CT scan results.  All questions were answered.  Patient is advised to have sutures out in 5 days.  Told to return here as needed.  For any worsening in her condition LACERATION REPAIR Performed by: Carlyle Dolly Authorized by: Carlyle Dolly Consent: Verbal consent obtained. Risks and benefits: risks, benefits and alternatives were discussed Consent given by: patient Patient identity confirmed: provided demographic data Prepped and Draped in normal sterile fashion Wound explored  Laceration Location: Right lateral 4 over the temple area  Laceration Length: 2.5 cm  No Foreign Bodies seen or  palpated  Anesthesia: local infiltration  Local anesthetic: lidocaine 2 % with epinephrine  Anesthetic total: 6 ml  Irrigation method: syringe Amount of cleaning: standard  Skin closure: 6-0 Prolene   Number of sutures: 7   Technique: Simple interrupted   Patient tolerance: Patient tolerated the procedure well with no immediate complications.   MDM      Carlyle Dolly, PA-C 08/08/13 1934

## 2013-08-08 NOTE — ED Notes (Signed)
Pt. At baseline on discharge. Denies pain.

## 2013-08-09 NOTE — ED Provider Notes (Signed)
Medical screening examination/treatment/procedure(s) were performed by non-physician practitioner and as supervising physician I was immediately available for consultation/collaboration.  EKG Interpretation   None        Courtney F Horton, MD 08/09/13 1043 

## 2014-05-24 ENCOUNTER — Other Ambulatory Visit (HOSPITAL_COMMUNITY)
Admission: RE | Admit: 2014-05-24 | Discharge: 2014-05-24 | Disposition: A | Discharge status: Home / Self Care | Payer: Medicare Other | Source: Ambulatory Visit | Attending: Family Medicine | Admitting: Family Medicine

## 2014-05-24 ENCOUNTER — Other Ambulatory Visit: Payer: Self-pay | Admitting: Family Medicine

## 2014-05-24 DIAGNOSIS — Z124 Encounter for screening for malignant neoplasm of cervix: Secondary | ICD-10-CM | POA: Insufficient documentation

## 2014-05-25 LAB — CYTOLOGY - PAP

## 2014-06-28 ENCOUNTER — Other Ambulatory Visit: Payer: Self-pay | Admitting: Family Medicine

## 2014-07-10 ENCOUNTER — Encounter (HOSPITAL_COMMUNITY): Payer: Self-pay

## 2014-07-10 ENCOUNTER — Ambulatory Visit (HOSPITAL_COMMUNITY)
Admission: RE | Admit: 2014-07-10 | Discharge: 2014-07-10 | Disposition: A | Discharge status: Home / Self Care | Payer: Medicare Other | Source: Ambulatory Visit | Attending: Family Medicine | Admitting: Family Medicine

## 2014-07-10 DIAGNOSIS — M810 Age-related osteoporosis without current pathological fracture: Secondary | ICD-10-CM | POA: Insufficient documentation

## 2014-07-10 MED ORDER — SODIUM CHLORIDE 0.9 % IV SOLN
INTRAVENOUS | Status: AC
  Administered 2014-07-10: 13:00:00 via INTRAVENOUS

## 2014-07-10 MED ORDER — ZOLEDRONIC ACID 5 MG/100ML IV SOLN
5.0000 mg | Freq: Once | INTRAVENOUS | Status: AC
  Administered 2014-07-10: 5 mg via INTRAVENOUS
  Filled 2014-07-10: qty 100

## 2014-07-10 NOTE — Discharge Instructions (Signed)

## 2015-01-25 ENCOUNTER — Encounter: Payer: Self-pay | Admitting: Internal Medicine

## 2015-01-25 ENCOUNTER — Ambulatory Visit (INDEPENDENT_AMBULATORY_CARE_PROVIDER_SITE_OTHER): Payer: Medicare Other | Admitting: Internal Medicine

## 2015-01-25 VITALS — BP 138/72 | HR 64 | Ht 66.0 in | Wt 144.0 lb

## 2015-01-25 DIAGNOSIS — R195 Other fecal abnormalities: Secondary | ICD-10-CM

## 2015-01-25 DIAGNOSIS — D509 Iron deficiency anemia, unspecified: Secondary | ICD-10-CM | POA: Diagnosis not present

## 2015-01-25 MED ORDER — PEG-KCL-NACL-NASULF-NA ASC-C 100 G PO SOLR: 1.0000 | Freq: Once | ORAL | Status: AC

## 2015-01-25 NOTE — Patient Instructions (Addendum)
You are scheduled for a colonoscopy/endoscopy 02/28/2015.  Stop Iron 1 week prior to procedure.  Dr Laurann Montanaynthia White

## 2015-01-25 NOTE — Progress Notes (Signed)
Maureen CrockerSarah R Ortiz 09/23/1950 [id number]  Note: This dictation was prepared with Dragon digital system. Any transcriptional errors that result from this procedure are unintentional.   History of Present Illness: This is a 64 year old Ortiz female referred  By Maureen Ortiz  for evaluation of iron deficiency anemia. On March 9 hemoglobin was 10.6 with MCV 75. Ferritin 9 and iron saturation of 8%. She denies visible blood per rectum. She denies constipation, heartburn, nausea, vomiting or weight loss. We have seen her in the past for constipation. Colonoscopy in 2006 was normal. Her last hemoglobin in our records  in November 2012 was  14.1 and 43.1. She is not on any anticoagulants. There is no family history of colon cancer. Patient has a history of congenital hydrocephalus and hearing loss. She also had major depressive disorder.    Past Medical History  Diagnosis Date  . Falls frequently   . Hydrocephalus   . Hard of hearing   . Depression   . Incontinent of urine   . DVT of lower extremity (deep venous thrombosis)   . Hypertension   . Seizures   . Vertigo     Past Surgical History  Procedure Laterality Date  . Csf shunt    . Breast biopsy      benign x 2  . Brain surgery      multiple  . Paraovarian cyst removal    . Tubal ligation    . Knee surgery      x 3  . Cochlear implant      left    Allergies  Allergen Reactions  . Codeine Nausea And Vomiting    Family history and social history have been reviewed.  Review of Systems: Denies abdominal pain diarrhea constipation nausea vomiting  The remainder of the 10 point ROS is negative except as outlined in the H&P  Physical Exam: General Appearance Well developed, in no distress, hard of hearing. In a wheelchair Eyes  Non icteric  HEENT  Non traumatic, normocephalic  Mouth No lesion, tongue papillated, no cheilosis Neck Supple without adenopathy, thyroid not enlarged, no carotid bruits, no JVD Lungs Clear to auscultation  bilaterally COR Normal S1, normal S2, regular rhythm, no murmur, quiet precordium Abdomen soft, nontender. Well healed surgical scars. Normoactive bowel sounds. Liver edge at costal margin. Palpable stool in the colon Rectal hard stool, strongly Hemoccult-positive Extremities  No pedal edema Skin No lesions Neurological Alert and oriented x 3,hard of hearing. Psychological Normal mood and affect, R of hearing. Responds appropriately  Assessment and Plan:   64 year old postmenopausal female with the new onset of iron deficiency anemia. No specific GI symptoms. Hemoccult-positive on my exam today. We will schedule  upper endoscopy as well as colonoscopy to rule out actively bleeding GI lesion. Possibilities include colon cancer. Colon polyps. AV malformations. I have talked to the patient as well as to her father who came with her. She will be able to prep for colonoscopy. She will discontinue iron supplements one week prior to the colonoscopy.  Maureen Maureen Ortiz.  Maureen Ortiz 01/25/2015

## 2015-01-26 ENCOUNTER — Telehealth: Payer: Self-pay | Admitting: *Deleted

## 2015-01-26 ENCOUNTER — Ambulatory Visit: Payer: Self-pay | Admitting: Internal Medicine

## 2015-01-26 NOTE — Telephone Encounter (Signed)
Talked to patient's father to let them know I can give them a free Suprep kit (Lot# [id number]; Exp: 11/2016) since their insurance will not cover. Patient will come in early next week (May 16-20, 2016) for me to go over new instructions.

## 2015-01-30 ENCOUNTER — Encounter: Payer: Self-pay | Admitting: Internal Medicine

## 2015-01-31 NOTE — Telephone Encounter (Signed)
Father stopped by and I explained new instructions for Suprep. Daughter is in a nursing home. Told the father he could call back if they had any further questions. He stated he understood. Gave them 1 free suprep kit, Lot #[id number]; Exp: 11/2016.

## 2015-02-03 ENCOUNTER — Emergency Department (HOSPITAL_COMMUNITY): Payer: Medicare Other

## 2015-02-03 ENCOUNTER — Emergency Department (HOSPITAL_COMMUNITY)
Admission: EM | Admit: 2015-02-03 | Discharge: 2015-02-04 | Disposition: A | Discharge status: Home / Self Care | Payer: Medicare Other | Source: Home / Self Care | Attending: Emergency Medicine | Admitting: Emergency Medicine

## 2015-02-03 ENCOUNTER — Encounter (HOSPITAL_COMMUNITY): Payer: Self-pay | Admitting: Nurse Practitioner

## 2015-02-03 DIAGNOSIS — H9190 Unspecified hearing loss, unspecified ear: Secondary | ICD-10-CM | POA: Insufficient documentation

## 2015-02-03 DIAGNOSIS — Z9181 History of falling: Secondary | ICD-10-CM | POA: Insufficient documentation

## 2015-02-03 DIAGNOSIS — R1031 Right lower quadrant pain: Secondary | ICD-10-CM | POA: Insufficient documentation

## 2015-02-03 DIAGNOSIS — Y752 Prosthetic and other implants, materials and neurological devices associated with adverse incidents: Secondary | ICD-10-CM | POA: Diagnosis present

## 2015-02-03 DIAGNOSIS — Z9621 Cochlear implant status: Secondary | ICD-10-CM | POA: Diagnosis present

## 2015-02-03 DIAGNOSIS — T8501XA Breakdown (mechanical) of ventricular intracranial (communicating) shunt, initial encounter: Principal | ICD-10-CM | POA: Diagnosis present

## 2015-02-03 DIAGNOSIS — R1011 Right upper quadrant pain: Secondary | ICD-10-CM | POA: Insufficient documentation

## 2015-02-03 DIAGNOSIS — R4701 Aphasia: Secondary | ICD-10-CM | POA: Diagnosis present

## 2015-02-03 DIAGNOSIS — Z86718 Personal history of other venous thrombosis and embolism: Secondary | ICD-10-CM

## 2015-02-03 DIAGNOSIS — G919 Hydrocephalus, unspecified: Secondary | ICD-10-CM | POA: Diagnosis present

## 2015-02-03 DIAGNOSIS — F329 Major depressive disorder, single episode, unspecified: Secondary | ICD-10-CM | POA: Insufficient documentation

## 2015-02-03 DIAGNOSIS — R4702 Dysphasia: Secondary | ICD-10-CM | POA: Diagnosis present

## 2015-02-03 DIAGNOSIS — Z885 Allergy status to narcotic agent status: Secondary | ICD-10-CM

## 2015-02-03 DIAGNOSIS — Z9851 Tubal ligation status: Secondary | ICD-10-CM | POA: Insufficient documentation

## 2015-02-03 DIAGNOSIS — R296 Repeated falls: Secondary | ICD-10-CM | POA: Diagnosis present

## 2015-02-03 DIAGNOSIS — Z79899 Other long term (current) drug therapy: Secondary | ICD-10-CM

## 2015-02-03 DIAGNOSIS — I10 Essential (primary) hypertension: Secondary | ICD-10-CM

## 2015-02-03 LAB — COMPREHENSIVE METABOLIC PANEL
ALBUMIN: 4.4 g/dL (ref 3.5–5.0)
ALT: 12 U/L — AB (ref 14–54)
AST: 25 U/L (ref 15–41)
Alkaline Phosphatase: 77 U/L (ref 38–126)
Anion gap: 17 — ABNORMAL HIGH (ref 5–15)
BUN: 22 mg/dL — ABNORMAL HIGH (ref 6–20)
CO2: 20 mmol/L — AB (ref 22–32)
CREATININE: 1.07 mg/dL — AB (ref 0.44–1.00)
Calcium: 9.6 mg/dL (ref 8.9–10.3)
Chloride: 102 mmol/L (ref 101–111)
GFR calc Af Amer: >60 mL/min (ref >60)
GFR, EST NON AFRICAN AMERICAN: 54 mL/min — AB (ref >60)
GLUCOSE: 229 mg/dL — AB (ref 65–99)
Potassium: 4.2 mmol/L (ref 3.5–5.1)
SODIUM: 139 mmol/L (ref 135–145)
Total Bilirubin: 0.9 mg/dL (ref 0.3–1.2)
Total Protein: 7.6 g/dL (ref 6.5–8.1)

## 2015-02-03 LAB — CBC WITH DIFFERENTIAL/PLATELET
BASOS ABS: 0 10*3/uL (ref 0.0–0.1)
BASOS PCT: 0 % (ref 0–1)
Eosinophils Absolute: 0 10*3/uL (ref 0.0–0.7)
Eosinophils Relative: 0 % (ref 0–5)
HCT: 37.9 % (ref 36.0–46.0)
HEMOGLOBIN: 11.6 g/dL — AB (ref 12.0–15.0)
Lymphocytes Relative: 20 % (ref 12–46)
Lymphs Abs: 2 10*3/uL (ref 0.7–4.0)
MCH: 23.7 pg — AB (ref 26.0–34.0)
MCHC: 30.6 g/dL (ref 30.0–36.0)
MCV: 77.3 fL — AB (ref 78.0–100.0)
MONO ABS: 0.7 10*3/uL (ref 0.1–1.0)
Monocytes Relative: 6 % (ref 3–12)
NEUTROS ABS: 7.7 10*3/uL (ref 1.7–7.7)
NEUTROS PCT: 74 % (ref 43–77)
PLATELETS: 356 10*3/uL (ref 150–400)
RBC: 4.9 MIL/uL (ref 3.87–5.11)
RDW: 16.6 % — ABNORMAL HIGH (ref 11.5–15.5)
WBC: 10.4 10*3/uL (ref 4.0–10.5)

## 2015-02-03 LAB — CBG MONITORING, ED: GLUCOSE-CAPILLARY: 201 mg/dL — AB (ref 65–99)

## 2015-02-03 LAB — TROPONIN I

## 2015-02-03 LAB — LIPASE, BLOOD: LIPASE: 20 U/L — AB (ref 22–51)

## 2015-02-03 MED ORDER — HYDROMORPHONE HCL 1 MG/ML IJ SOLN
1.0000 mg | Freq: Once | INTRAMUSCULAR | Status: AC
Start: 2015-02-03 — End: 2015-02-03
  Administered 2015-02-03: 1 mg via INTRAVENOUS
  Filled 2015-02-03: qty 1

## 2015-02-03 MED ORDER — IOHEXOL 300 MG/ML  SOLN
100.0000 mL | Freq: Once | INTRAMUSCULAR | Status: AC | PRN
  Administered 2015-02-03: 100 mL via INTRAVENOUS

## 2015-02-03 MED ORDER — IOHEXOL 300 MG/ML  SOLN
50.0000 mL | Freq: Once | INTRAMUSCULAR | Status: AC | PRN
  Administered 2015-02-03: 50 mL via ORAL

## 2015-02-03 MED ORDER — ONDANSETRON HCL 4 MG/2ML IJ SOLN
4.0000 mg | Freq: Once | INTRAMUSCULAR | Status: AC
  Administered 2015-02-03: 4 mg via INTRAVENOUS
  Filled 2015-02-03: qty 2

## 2015-02-03 MED ORDER — SODIUM CHLORIDE 0.9 % IV BOLUS (SEPSIS)
1000.0000 mL | Freq: Once | INTRAVENOUS | Status: AC
  Administered 2015-02-03: 1000 mL via INTRAVENOUS

## 2015-02-03 NOTE — ED Provider Notes (Signed)
CSN: [id number]     Arrival date & time 02/03/15  2150 History   First MD Initiated Contact with Patient 02/03/15 2200     Chief Complaint  Patient presents with  . Abdominal Pain    RL Quadrants     (Consider location/radiation/quality/duration/timing/severity/associated sxs/prior Treatment) HPI Comments: Patient with past medical history of hydrocephalus, hypertension, and seizures presents to the emergency department with chief complaint of right lower quadrant abdominal pain. Patient states pain started suddenly today. The pain is moderate to severe. She reports associated nausea, but denies any vomiting or diarrhea. Denies any fevers, chills, chest pain, shortness of breath. She has not taken anything to alleviate the symptoms. Symptoms are worsened with palpation. She denies any prior abdominal surgeries.  The history is provided by the patient. No language interpreter was used.    Past Medical History  Diagnosis Date  . Falls frequently   . Hydrocephalus   . Hard of hearing   . Depression   . Incontinent of urine   . DVT of lower extremity (deep venous thrombosis)   . Hypertension   . Seizures   . Vertigo    Past Surgical History  Procedure Laterality Date  . Csf shunt    . Breast biopsy      benign x 2  . Brain surgery      multiple  . Paraovarian cyst removal    . Tubal ligation    . Knee surgery      x 3  . Cochlear implant      left   Family History  Problem Relation Age of Onset  . Stroke Mother   . Heart disease Father   . Heart disease Mother   . Colon cancer Neg Hx    History  Substance Use Topics  . Smoking status: Never Smoker   . Smokeless tobacco: Never Used  . Alcohol Use: Yes     Comment: occasionally   OB History    No data available     Review of Systems  Constitutional: Negative for fever and chills.  Respiratory: Negative for shortness of breath.   Cardiovascular: Negative for chest pain.  Gastrointestinal: Positive for  abdominal pain. Negative for nausea, vomiting, diarrhea and constipation.  Genitourinary: Negative for dysuria.  All other systems reviewed and are negative.     Allergies  Codeine  Home Medications   Prior to Admission medications   Medication Sig Start Date End Date Taking? Authorizing Provider  calcium-vitamin D (OSCAL WITH D) 500-200 MG-UNIT per tablet Take 1 tablet by mouth daily.     Yes Historical Provider, MD  cholecalciferol (VITAMIN D) 1000 UNITS tablet Take 1,000 Units by mouth daily.   Yes Historical Provider, MD  ferrous sulfate 325 (65 FE) MG EC tablet Take 325 mg by mouth daily.     Yes Historical Provider, MD  FLUoxetine (PROZAC) 40 MG capsule TAKE 1 BY MOUTH EVERY MORNING   Yes Historical Provider, MD  losartan (COZAAR) 50 MG tablet Take 50 mg by mouth daily.   Yes Historical Provider, MD  Multiple Vitamins-Minerals (MULTIVITAMINS THER. W/MINERALS) TABS Take 1 tablet by mouth daily.     Yes Historical Provider, MD  peg 3350 powder (MOVIPREP) 100 G SOLR Take 1 kit (200 g total) by mouth once. 01/25/15 02/24/15  Lafayette Dragon, MD   BP 146/84 mmHg  Pulse 92  Temp(Src) 97.6 F (36.4 C) (Axillary)  Resp 18  SpO2 100% Physical Exam  Constitutional: She is oriented  to person, place, and time. She appears well-developed and well-nourished.  HENT:  Head: Normocephalic and atraumatic.  Eyes: Conjunctivae and EOM are normal. Pupils are equal, round, and reactive to light.  Neck: Normal range of motion. Neck supple.  Cardiovascular: Normal rate and regular rhythm.  Exam reveals no gallop and no friction rub.   No murmur heard. Pulmonary/Chest: Effort normal and breath sounds normal. No respiratory distress. She has no wheezes. She has no rales. She exhibits no tenderness.  Abdominal: Soft. Bowel sounds are normal. She exhibits no distension and no mass. There is tenderness. There is no rebound and no guarding.  Right lower quadrant and right upper quadrant tenderness  palpation, no other focal abdominal tenderness  Musculoskeletal: Normal range of motion. She exhibits no edema or tenderness.  Neurological: She is alert and oriented to person, place, and time.  Skin: Skin is warm and dry.  Psychiatric: She has a normal mood and affect. Her behavior is normal. Judgment and thought content normal.  Nursing note and vitals reviewed.   ED Course  Procedures (including critical care time) Results for orders placed or performed during the hospital encounter of 02/03/15  CBC with Differential  Result Value Ref Range   WBC 10.4 4.0 - 10.5 K/uL   RBC 4.90 3.87 - 5.11 MIL/uL   Hemoglobin 11.6 (L) 12.0 - 15.0 g/dL   HCT 37.9 36.0 - 46.0 %   MCV 77.3 (L) 78.0 - 100.0 fL   MCH 23.7 (L) 26.0 - 34.0 pg   MCHC 30.6 30.0 - 36.0 g/dL   RDW 16.6 (H) 11.5 - 15.5 %   Platelets 356 150 - 400 K/uL   Neutrophils Relative % 74 43 - 77 %   Neutro Abs 7.7 1.7 - 7.7 K/uL   Lymphocytes Relative 20 12 - 46 %   Lymphs Abs 2.0 0.7 - 4.0 K/uL   Monocytes Relative 6 3 - 12 %   Monocytes Absolute 0.7 0.1 - 1.0 K/uL   Eosinophils Relative 0 0 - 5 %   Eosinophils Absolute 0.0 0.0 - 0.7 K/uL   Basophils Relative 0 0 - 1 %   Basophils Absolute 0.0 0.0 - 0.1 K/uL  Comprehensive metabolic panel  Result Value Ref Range   Sodium 139 135 - 145 mmol/L   Potassium 4.2 3.5 - 5.1 mmol/L   Chloride 102 101 - 111 mmol/L   CO2 20 (L) 22 - 32 mmol/L   Glucose, Bld 229 (H) 65 - 99 mg/dL   BUN 22 (H) 6 - 20 mg/dL   Creatinine, Ser 1.07 (H) 0.44 - 1.00 mg/dL   Calcium 9.6 8.9 - 10.3 mg/dL   Total Protein 7.6 6.5 - 8.1 g/dL   Albumin 4.4 3.5 - 5.0 g/dL   AST 25 15 - 41 U/L   ALT 12 (L) 14 - 54 U/L   Alkaline Phosphatase 77 38 - 126 U/L   Total Bilirubin 0.9 0.3 - 1.2 mg/dL   GFR calc non Af Amer 54 (L) >60 mL/min   GFR calc Af Amer >60 >60 mL/min   Anion gap 17 (H) 5 - 15  Lipase, blood  Result Value Ref Range   Lipase 20 (L) 22 - 51 U/L  Troponin I  (only if pt is 64 y.o. or older  and pain is above umbilicus)  Result Value Ref Range   Troponin I <0.03 <0.031 ng/mL  CBG monitoring, ED  Result Value Ref Range   Glucose-Capillary 201 (H) 65 - 99  mg/dL   Ct Abdomen Pelvis W Contrast  02/04/2015   CLINICAL DATA:  Sudden onset of severe abdominal pain after dinner this evening. Right lower quadrant pain.  EXAM: CT ABDOMEN AND PELVIS WITH CONTRAST  TECHNIQUE: Multidetector CT imaging of the abdomen and pelvis was performed using the standard protocol following bolus administration of intravenous contrast.  CONTRAST:  64m OMNIPAQUE IOHEXOL 300 MG/ML SOLN, 1040mOMNIPAQUE IOHEXOL 300 MG/ML SOLN  COMPARISON:  None.  FINDINGS: Large hiatal/ paraesophageal hernia, uppermost aspect not included in the field of view. The lung bases are otherwise clear.  There are 2 small heterogeneously hypodense lesions in the liver, 9 mm inferiorly in the right hepatic lobe, and 11 mm in segment 6/7. These have the appearance of hemangiomas. The gallbladder is physiologically distended. The spleen and adrenal glands are normal. There is pancreatic atrophy without ductal dilatation or surrounding inflammatory change. Kidneys demonstrate symmetric enhancement and excretion without hydronephrosis or localizing abnormality.  Contrast distending the stomach, large hiatal hernia/paraesophageal hernia. No obstruction. There are no dilated or thickened small bowel loops. The appendix is seen arising from the cecum and contains small amount of high-density material, likely contrast. There is no periappendiceal inflammatory change. The cecum is fluid-filled. Normal fatty terminal ileum is seen, axial image 56/91. In the proximal ascending colon there is a 1.4 cm fat density lesion that may reflect a mural lipoma, axial image 49/91, fluid is seen in the cecum. No proximal bowel dilatation. Small volume of stool in the proximal colon, moderate volume of stool in the more distal colon. No colonic wall thickening. No free  air, free fluid, or intra-abdominal fluid collection.  No retroperitoneal adenopathy. Abdominal aorta is normal in caliber. Moderate atherosclerosis of the abdominal aorta and branches. Small bore peritoneal catheter coursing in the right anterior abdominal wall, tip in the right lower quadrant without adjacent fluid collection. There is a larger peritoneal catheter, coursing on the left anterior abdominal wall, tip in the left upper quadrant. No adjacent fluid collection.  Within the pelvis the bladder is physiologically distended. The uterus remains in situ. The ovaries are not discretely identified, likely atrophic and normal for age. There is no free fluid in the pelvis. No pelvic adenopathy.  Compression deformity of L4 with large superior and inferior endplate Schmorl's nodes. Moderate compression deformity of L1 primarily involving superior endplate. There is moderate degenerative change throughout the lumbar spine.  IMPRESSION: 1. Contrast distending the stomach with large hiatal/paraesophageal hernia. No evidence of volvulus or obstruction. 2. Fat density lesion probable wall lipoma in the ascending colon measuring 1.4 cm. No proximal obstruction. This could be further assessed with colonoscopy. The appendix is normal. 3. Hepatic hemangiomas. 4. Remote compression deformities of L1 and L4.   Electronically Signed   By: MeJeb Levering.D.   On: 02/04/2015 00:32      EKG Interpretation None      MDM   Final diagnoses:  RLQ abdominal pain  RLQ abdominal pain    Patient with right sided abdominal pain that started suddenly today. Pain is severe. We'll treat pain, check labs, will get CT and will reassess.  Labs and CT are reassuring. Patient is pain-free. She states that she is ready to be discharge. Labs and imaging reviewed with Dr. OnClaudine Mouton who agrees with discharge plan. Will recommend primary care and gastroenterology follow-up.    RoMontine CirclePA-C 02/04/15 [id number] Chapel MD 02/04/15 [id number]

## 2015-02-03 NOTE — ED Notes (Signed)
Bed: [id number] Expected date:  Expected time:  Means of arrival:  Comments: EMS 64 yo female abdominal pain

## 2015-02-03 NOTE — ED Notes (Signed)
Pt presents from [facility], c/o sudden severe abdominal pain post dinner. Denies n/v/d, family reports that she was recently told that she is suspect of abdominal/GI bleed and scheduled for GI follow up to explore it.

## 2015-02-04 LAB — URINALYSIS, ROUTINE W REFLEX MICROSCOPIC
Bilirubin Urine: NEGATIVE
GLUCOSE, UA: NEGATIVE mg/dL
KETONES UR: 15 mg/dL — AB
Nitrite: NEGATIVE
PH: 5.5 (ref 5.0–8.0)
PROTEIN: NEGATIVE mg/dL
UROBILINOGEN UA: 1 mg/dL (ref 0.0–1.0)

## 2015-02-04 LAB — URINE MICROSCOPIC-ADD ON

## 2015-02-04 NOTE — Discharge Instructions (Signed)

## 2015-02-07 ENCOUNTER — Emergency Department (HOSPITAL_COMMUNITY): Payer: Medicare Other

## 2015-02-07 ENCOUNTER — Encounter (HOSPITAL_COMMUNITY): Payer: Self-pay | Admitting: *Deleted

## 2015-02-07 ENCOUNTER — Inpatient Hospital Stay (HOSPITAL_COMMUNITY)
Admission: EM | Admit: 2015-02-07 | Discharge: 2015-02-15 | DRG: 032 | Disposition: A | Discharge status: Skilled Nursing Facility | Payer: Medicare Other | Attending: Neurological Surgery | Admitting: Neurological Surgery

## 2015-02-07 DIAGNOSIS — T8501XA Breakdown (mechanical) of ventricular intracranial (communicating) shunt, initial encounter: Secondary | ICD-10-CM | POA: Diagnosis present

## 2015-02-07 DIAGNOSIS — I10 Essential (primary) hypertension: Secondary | ICD-10-CM | POA: Diagnosis not present

## 2015-02-07 DIAGNOSIS — R296 Repeated falls: Secondary | ICD-10-CM | POA: Diagnosis present

## 2015-02-07 DIAGNOSIS — G919 Hydrocephalus, unspecified: Secondary | ICD-10-CM | POA: Diagnosis not present

## 2015-02-07 DIAGNOSIS — R4701 Aphasia: Secondary | ICD-10-CM | POA: Diagnosis present

## 2015-02-07 DIAGNOSIS — W19XXXA Unspecified fall, initial encounter: Secondary | ICD-10-CM

## 2015-02-07 DIAGNOSIS — Y752 Prosthetic and other implants, materials and neurological devices associated with adverse incidents: Secondary | ICD-10-CM | POA: Diagnosis present

## 2015-02-07 DIAGNOSIS — Z982 Presence of cerebrospinal fluid drainage device: Secondary | ICD-10-CM

## 2015-02-07 DIAGNOSIS — T85618A Breakdown (mechanical) of other specified internal prosthetic devices, implants and grafts, initial encounter: Secondary | ICD-10-CM | POA: Diagnosis present

## 2015-02-07 DIAGNOSIS — Z885 Allergy status to narcotic agent status: Secondary | ICD-10-CM | POA: Diagnosis not present

## 2015-02-07 DIAGNOSIS — H9190 Unspecified hearing loss, unspecified ear: Secondary | ICD-10-CM | POA: Diagnosis present

## 2015-02-07 DIAGNOSIS — R4702 Dysphasia: Secondary | ICD-10-CM | POA: Diagnosis present

## 2015-02-07 DIAGNOSIS — Z9621 Cochlear implant status: Secondary | ICD-10-CM | POA: Diagnosis present

## 2015-02-07 LAB — CBC WITH DIFFERENTIAL/PLATELET
Basophils Absolute: 0 10*3/uL (ref 0.0–0.1)
Basophils Relative: 0 % (ref 0–1)
EOS ABS: 0 10*3/uL (ref 0.0–0.7)
Eosinophils Relative: 0 % (ref 0–5)
HCT: 38.4 % (ref 36.0–46.0)
Hemoglobin: 12.1 g/dL (ref 12.0–15.0)
LYMPHS PCT: 7 % — AB (ref 12–46)
Lymphs Abs: 0.7 10*3/uL (ref 0.7–4.0)
MCH: 24.2 pg — AB (ref 26.0–34.0)
MCHC: 31.5 g/dL (ref 30.0–36.0)
MCV: 76.8 fL — AB (ref 78.0–100.0)
Monocytes Absolute: 0.4 10*3/uL (ref 0.1–1.0)
Monocytes Relative: 4 % (ref 3–12)
Neutro Abs: 9.1 10*3/uL — ABNORMAL HIGH (ref 1.7–7.7)
Neutrophils Relative %: 89 % — ABNORMAL HIGH (ref 43–77)
PLATELETS: 345 10*3/uL (ref 150–400)
RBC: 5 MIL/uL (ref 3.87–5.11)
RDW: 17.1 % — AB (ref 11.5–15.5)
WBC: 10.2 10*3/uL (ref 4.0–10.5)

## 2015-02-07 LAB — URINALYSIS, ROUTINE W REFLEX MICROSCOPIC
Glucose, UA: NEGATIVE mg/dL
Hgb urine dipstick: NEGATIVE
KETONES UR: 40 mg/dL — AB
Nitrite: NEGATIVE
PROTEIN: NEGATIVE mg/dL
Specific Gravity, Urine: 1.025 (ref 1.005–1.030)
Urobilinogen, UA: 1 mg/dL (ref 0.0–1.0)
pH: 6 (ref 5.0–8.0)

## 2015-02-07 LAB — COMPREHENSIVE METABOLIC PANEL
ALT: 13 U/L — ABNORMAL LOW (ref 14–54)
AST: 16 U/L (ref 15–41)
Albumin: 3.8 g/dL (ref 3.5–5.0)
Alkaline Phosphatase: 72 U/L (ref 38–126)
Anion gap: 12 (ref 5–15)
BUN: 13 mg/dL (ref 6–20)
CO2: 26 mmol/L (ref 22–32)
Calcium: 9.6 mg/dL (ref 8.9–10.3)
Chloride: 100 mmol/L — ABNORMAL LOW (ref 101–111)
Creatinine, Ser: 0.76 mg/dL (ref 0.44–1.00)
GFR calc non Af Amer: >60 mL/min (ref >60)
GLUCOSE: 129 mg/dL — AB (ref 65–99)
Potassium: 3.7 mmol/L (ref 3.5–5.1)
SODIUM: 138 mmol/L (ref 135–145)
Total Bilirubin: 0.8 mg/dL (ref 0.3–1.2)
Total Protein: 6.5 g/dL (ref 6.5–8.1)

## 2015-02-07 LAB — URINE MICROSCOPIC-ADD ON

## 2015-02-07 LAB — CBG MONITORING, ED: Glucose-Capillary: 131 mg/dL — ABNORMAL HIGH (ref 65–99)

## 2015-02-07 MED ORDER — ACETAMINOPHEN 325 MG PO TABS: 650.0000 mg | ORAL_TABLET | ORAL | Status: DC | PRN

## 2015-02-07 MED ORDER — CALCIUM CARBONATE-VITAMIN D 500-200 MG-UNIT PO TABS
1.0000 | ORAL_TABLET | Freq: Every day | ORAL | Status: DC
  Administered 2015-02-08 – 2015-02-15 (×7): 1 via ORAL
  Filled 2015-02-07 (×9): qty 1

## 2015-02-07 MED ORDER — THERA M PLUS PO TABS: 1.0000 | ORAL_TABLET | Freq: Every day | ORAL | Status: DC

## 2015-02-07 MED ORDER — ACETAMINOPHEN 650 MG RE SUPP: 650.0000 mg | RECTAL | Status: DC | PRN

## 2015-02-07 MED ORDER — CALCIUM CARBONATE-VITAMIN D 500-200 MG-UNIT PO TABS: 1.0000 | ORAL_TABLET | Freq: Every day | ORAL | Status: DC

## 2015-02-07 MED ORDER — FERROUS SULFATE 325 (65 FE) MG PO TABS
325.0000 mg | ORAL_TABLET | Freq: Every day | ORAL | Status: DC
  Administered 2015-02-08 – 2015-02-15 (×6): 325 mg via ORAL
  Filled 2015-02-07 (×9): qty 1

## 2015-02-07 MED ORDER — FERROUS SULFATE 325 (65 FE) MG PO TBEC: 325.0000 mg | DELAYED_RELEASE_TABLET | Freq: Every day | ORAL | Status: DC

## 2015-02-07 MED ORDER — LOSARTAN POTASSIUM 50 MG PO TABS
50.0000 mg | ORAL_TABLET | Freq: Every day | ORAL | Status: DC
  Administered 2015-02-07 – 2015-02-15 (×9): 50 mg via ORAL
  Filled 2015-02-07 (×9): qty 1

## 2015-02-07 MED ORDER — PHENOL 1.4 % MT LIQD
1.0000 | OROMUCOSAL | Status: DC | PRN
Start: 2015-02-07 — End: 2015-02-09

## 2015-02-07 MED ORDER — POTASSIUM CHLORIDE IN NACL 20-0.9 MEQ/L-% IV SOLN
INTRAVENOUS | Status: DC
  Administered 2015-02-07: 22:00:00 via INTRAVENOUS
  Filled 2015-02-07: qty 1000

## 2015-02-07 MED ORDER — ONDANSETRON HCL 4 MG/2ML IJ SOLN: 4.0000 mg | INTRAMUSCULAR | Status: DC | PRN

## 2015-02-07 MED ORDER — MENTHOL 3 MG MT LOZG: 1.0000 | LOZENGE | OROMUCOSAL | Status: DC | PRN

## 2015-02-07 MED ORDER — FLUOXETINE HCL 20 MG PO CAPS
40.0000 mg | ORAL_CAPSULE | Freq: Every day | ORAL | Status: DC
  Administered 2015-02-07 – 2015-02-15 (×9): 40 mg via ORAL
  Filled 2015-02-07 (×11): qty 2

## 2015-02-07 MED ORDER — ADULT MULTIVITAMIN W/MINERALS CH
1.0000 | ORAL_TABLET | Freq: Every day | ORAL | Status: DC
  Administered 2015-02-08 – 2015-02-15 (×7): 1 via ORAL
  Filled 2015-02-07 (×9): qty 1

## 2015-02-07 NOTE — ED Notes (Signed)
Pt in from [facility], per report the pt normally uses a wheelchair & at breakfast was at her baseline, pts baseline is A&O x4, follows commands, speaks in complete sentences, per report pt was found on the floor today, unknown LOC & EMS was called out at 10am d/t pts altered mental status, pt reported to be confused, pt moves all extremities, pt has abrasion to Posterior R arm, pt has quarter sized hematoma to R forehead, pt is hard of hearing, pt has blood to R head where hearing aid was located, pts hearing aid broken upon arrival to ED

## 2015-02-07 NOTE — ED Provider Notes (Signed)
CSN: [id number]     Arrival date & time 02/07/15  1059 History   First MD Initiated Contact with Patient 02/07/15 1120     Chief Complaint  Patient presents with  . Altered Mental Status  . Fall     (Consider location/radiation/quality/duration/timing/severity/associated sxs/prior Treatment) HPI Comments: Patient found at nursing home on the floor with bleeding from her occipital area. Does have a history of hydrocephalus and has a shunt in place. Bleeding controlled with direct pressure. Facility felt patient was altered at this time. No recent illnesses according to the facility. Patients become confused. Was transferred here for further management. No further history obtainable due to her current state  Patient is a 64 y.o. female presenting with altered mental status and fall. The history is provided by the patient. The history is limited by the condition of the patient.  Altered Mental Status Fall    Past Medical History  Diagnosis Date  . Falls frequently   . Hydrocephalus   . Hard of hearing   . Depression   . Incontinent of urine   . DVT of lower extremity (deep venous thrombosis)   . Hypertension   . Seizures   . Vertigo    Past Surgical History  Procedure Laterality Date  . Csf shunt    . Breast biopsy      benign x 2  . Brain surgery      multiple  . Paraovarian cyst removal    . Tubal ligation    . Knee surgery      x 3  . Cochlear implant      left   Family History  Problem Relation Age of Onset  . Stroke Mother   . Heart disease Father   . Heart disease Mother   . Colon cancer Neg Hx    History  Substance Use Topics  . Smoking status: Never Smoker   . Smokeless tobacco: Never Used  . Alcohol Use: Yes     Comment: occasionally   OB History    No data available     Review of Systems  Unable to perform ROS     Allergies  Codeine  Home Medications   Prior to Admission medications   Medication Sig Start Date End Date Taking?  Authorizing Provider  calcium-vitamin D (OSCAL WITH D) 500-200 MG-UNIT per tablet Take 1 tablet by mouth daily.      Historical Provider, MD  cholecalciferol (VITAMIN D) 1000 UNITS tablet Take 1,000 Units by mouth daily.    Historical Provider, MD  ferrous sulfate 325 (65 FE) MG EC tablet Take 325 mg by mouth daily.      Historical Provider, MD  FLUoxetine (PROZAC) 40 MG capsule TAKE 1 BY MOUTH EVERY MORNING    Historical Provider, MD  losartan (COZAAR) 50 MG tablet Take 50 mg by mouth daily.    Historical Provider, MD  Multiple Vitamins-Minerals (MULTIVITAMINS THER. W/MINERALS) TABS Take 1 tablet by mouth daily.      Historical Provider, MD  peg 3350 powder (MOVIPREP) 100 G SOLR Take 1 kit (200 g total) by mouth once. 01/25/15 02/24/15  Lafayette Dragon, MD   Pulse 77  Temp(Src) 97.6 F (36.4 C) (Rectal)  Resp 18  Ht 5' 6"  (1.676 m)  Wt 150 lb (68.04 kg)  BMI 24.22 kg/m2  SpO2 95% Physical Exam  Constitutional: She appears well-developed and well-nourished.  Non-toxic appearance. No distress.  HENT:  Head: Normocephalic and atraumatic.    Eyes: Conjunctivae,  EOM and lids are normal. Pupils are equal, round, and reactive to light.  Neck: Normal range of motion. Neck supple. No spinous process tenderness and no muscular tenderness present. No tracheal deviation present. No thyroid mass present.  Cardiovascular: Normal rate, regular rhythm and normal heart sounds.  Exam reveals no gallop.   No murmur heard. Pulmonary/Chest: Effort normal and breath sounds normal. No stridor. No respiratory distress. She has no decreased breath sounds. She has no wheezes. She has no rhonchi. She has no rales.  Abdominal: Soft. Normal appearance and bowel sounds are normal. She exhibits no distension. There is no tenderness. There is no rebound and no CVA tenderness.  Musculoskeletal: Normal range of motion. She exhibits no edema or tenderness.  Neurological: She is alert. She has normal strength. No cranial  nerve deficit or sensory deficit. GCS eye subscore is 4. GCS verbal subscore is 5. GCS motor subscore is 6.  Skin: Skin is warm and dry. No abrasion and no rash noted.  Psychiatric: Her mood appears anxious. Her speech is tangential. She is slowed.  Nursing note and vitals reviewed.   ED Course  Procedures (including critical care time) Labs Review Labs Reviewed  CBG MONITORING, ED - Abnormal; Notable for the following:    Glucose-Capillary 131 (*)    All other components within normal limits  URINE CULTURE  URINALYSIS, ROUTINE W REFLEX MICROSCOPIC  CBC WITH DIFFERENTIAL/PLATELET  COMPREHENSIVE METABOLIC PANEL    Imaging Review No results found.   EKG Interpretation   Date/Time:  Wednesday Feb 07 2015 11:14:58 EDT Ventricular Rate:  78 PR Interval:  126 QRS Duration: 90 QT Interval:  410 QTC Calculation: 467 R Axis:   81 Text Interpretation:  Sinus rhythm Consider right atrial enlargement  Borderline right axis deviation Low voltage, precordial leads No  significant change since last tracing Confirmed by Zenia Resides  MD, Rishav Rockefeller  ([id number]) on 02/07/2015 11:42:24 AM      MDM   Final diagnoses:  Fall  Fall      Patient's head CT results noted. Consult with Dr. Ronnald Ramp from neurosurgery and he will see the patient in the department  Lacretia Leigh, MD 02/07/15 1454

## 2015-02-07 NOTE — ED Notes (Signed)
Neuro surgeon at bedside discussing plan of care

## 2015-02-07 NOTE — ED Notes (Signed)
Notified RN of CBG 131 

## 2015-02-07 NOTE — H&P (Signed)
Reason for Consult:HCP/ broken VPS Referring Physician: EDP  Maureen Ortiz is an 64 y.o. female.   HPI:  64 yo WF with very complex PMH with multiple VP shunt placements who presents with a several day h/o changes in function. She has dysphasia at baseline and frequent falls. Maybe has been sleeping a little more, but denies headache or nausea and vomiting. Much of the communication is through her daughter who works at our hospital. She has seen Dr. Newell Coral and Dr. Angelyn Punt in the past. Her last head CT here was in 2014 which she had a head CT in May 2015 at [facility]. Head CT here showed fracture of the left frontal ventricular catheter and increasing ventricular size when compared to her previous head CT. Neurosurgical evaluation was requested.  Past Medical History  Diagnosis Date  . Falls frequently   . Hydrocephalus   . Hard of hearing   . Depression   . Incontinent of urine   . DVT of lower extremity (deep venous thrombosis)   . Hypertension   . Seizures   . Vertigo     Past Surgical History  Procedure Laterality Date  . Csf shunt    . Breast biopsy      benign x 2  . Brain surgery      multiple  . Paraovarian cyst removal    . Tubal ligation    . Knee surgery      x 3  . Cochlear implant      left    Allergies  Allergen Reactions  . Codeine Nausea And Vomiting    History  Substance Use Topics  . Smoking status: Never Smoker   . Smokeless tobacco: Never Used  . Alcohol Use: Yes     Comment: occasionally    Family History  Problem Relation Age of Onset  . Stroke Mother   . Heart disease Father   . Heart disease Mother   . Colon cancer Neg Hx      Review of Systems  Positive ROS: Frequent falls and a aphasia  All other systems have been reviewed and were otherwise negative with the exception of those mentioned in the HPI and as above.  Objective: Vital signs in last 24 hours: Temp:  [97.6 F (36.4 C)] 97.6 F (36.4 C) (05/25 1129) Pulse  Rate:  [72-85] 85 (05/25 1600) Resp:  [14-22] 18 (05/25 1600) BP: (146-176)/(73-94) 164/76 mmHg (05/25 1530) SpO2:  [93 %-100 %] 100 % (05/25 1600) Weight:  [150 lb (68.04 kg)] 150 lb (68.04 kg) (05/25 1110)  General Appearance: Alert, cooperative, no distress, appears stated age Head: Fairly large cranium, left cochlear implant , left parietal and frontal shunt palpable, small right parietal abrasion and laceration with dried blood in the hair Eyes: PERRL, conjunctiva/corneas clear, EOM's intact     Neck: Supple, symmetrical, trachea midline Back: Symmetric, no curvature, ROM normal, no CVA tenderness Lungs:  respirations unlabored Heart: Regular rate and rhythm Abdomen: Soft, non-tender, bowel sounds active all four quadrants, no masses, no organomegaly Extremities: Extremities normal, atraumatic, no cyanosis or edema   NEUROLOGIC:   Mental status: Awake and alert, seems to understand speech but has difficulty with fluency Motor Exam - grossly normal, normal tone and bulk Sensory Exam - grossly normal Reflexes: symmetric, no pathologic reflexes, No Hoffman's, No clonus Coordination - not tested Gait - not tested Balance - not tested Cranial Nerves: I: smell Not tested  II: visual acuity  OS: na  OD: na  II: visual fields Full to confrontation  II: pupils Equal, round, reactive to light  III,VII: ptosis None  III,IV,VI: extraocular muscles  Full ROM  V: mastication Normal  V: facial light touch sensation  Normal  V,VII: corneal reflex  Present  VII: facial muscle function - upper  Normal  VII: facial muscle function - lower Normal  VIII: hearing Not tested  IX: soft palate elevation  Normal  IX,X: gag reflex Present  XI: trapezius strength  5/5  XI: sternocleidomastoid strength 5/5  XI: neck flexion strength  5/5  XII: tongue strength  Normal    Data Review Lab Results  Component Value Date   WBC 10.2 02/07/2015   HGB 12.1 02/07/2015   HCT 38.4 02/07/2015   MCV  76.8* 02/07/2015   PLT 345 02/07/2015   Lab Results  Component Value Date   NA 138 02/07/2015   K 3.7 02/07/2015   CL 100* 02/07/2015   CO2 26 02/07/2015   BUN 13 02/07/2015   CREATININE 0.76 02/07/2015   GLUCOSE 129* 02/07/2015   Lab Results  Component Value Date   INR 2.11* 07/07/2011    Radiology: Ct Head Wo Contrast  02/07/2015   CLINICAL DATA:  Patient found on floor with increased confusion and hematoma right forehead.  EXAM: CT HEAD WITHOUT CONTRAST  CT CERVICAL SPINE WITHOUT CONTRAST  TECHNIQUE: Multidetector CT imaging of the head and cervical spine was performed following the standard protocol without intravenous contrast. Multiplanar CT image reconstructions of the cervical spine were also generated.  COMPARISON:  08/08/2013 and 03/09/2013  FINDINGS: CT HEAD FINDINGS  Previous suboccipital craniectomy with postsurgical changes stable. Prior left frontoparietal craniotomy unchanged. 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 Left occipital ventriculostomy catheter unchanged. The intracranial segment of the left frontal ventriculostomy catheter is no longer seen, however there is a segment of ventriculostomy catheter lying within the mid to posterior body of the right lateral ventricle which may represent the fractured segment of the previously identified left ventriculostomy catheter.  There has been interval enlargement in the ventricles. No change in moderate white matter low-attenuation over the left hemisphere. No change in a large elliptical calcified extra-axial structure over the right frontal region. Postsurgical defect over the right frontoparietal skull unchanged. No evidence of acute hemorrhage or acute infarction. No significant midline shift.  CT CERVICAL SPINE FINDINGS  There straightening of the normal cervical lordosis. There is mild spondylosis throughout the cervical spine. There is disc space narrowing from the C5-6 level to the C7-T1 level unchanged.  Prevertebral soft tissues are within normal. Atlantoaxial articulation is normal. No acute fracture or subluxation. Remainder the exam is unchanged.  IMPRESSION: Since the prior study from 08/08/2013, there has been interval enlargement of the ventricles. There are also findings suggesting fracture of the intracranial segment of the previously seen left frontal ventriculostomy catheter as this segment of catheter now lies within the mid to posterior body of the right lateral ventricle.  Other chronic and postsurgical findings unchanged.  No acute cervical spine injury.  Mild spondylosis of the cervical spine with disc disease from the C5-6 level to the C7-T1 level.  These results were called by telephone at the time of interpretation on 02/07/2015 at 1:18 pm to Dr. Lorre NickANTHONY ALLEN , who verbally acknowledged these results.   Electronically Signed   By: Elberta Fortisaniel  Boyle M.D.   On: 02/07/2015 13:19   Ct Cervical Spine Wo Contrast  02/07/2015   CLINICAL DATA:  Patient found  on floor with increased confusion and hematoma right forehead.  EXAM: CT HEAD WITHOUT CONTRAST  CT CERVICAL SPINE WITHOUT CONTRAST  TECHNIQUE: Multidetector CT imaging of the head and cervical spine was performed following the standard protocol without intravenous contrast. Multiplanar CT image reconstructions of the cervical spine were also generated.  COMPARISON:  08/08/2013 and 03/09/2013  FINDINGS: CT HEAD FINDINGS  Previous suboccipital craniectomy with postsurgical changes stable. Prior left frontoparietal craniotomy unchanged. 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 Left occipital ventriculostomy catheter unchanged. The intracranial segment of the left frontal ventriculostomy catheter is no longer seen, however there is a segment of ventriculostomy catheter lying within the mid to posterior body of the right lateral ventricle which may represent the fractured segment of the previously identified left ventriculostomy catheter.   There has been interval enlargement in the ventricles. No change in moderate white matter low-attenuation over the left hemisphere. No change in a large elliptical calcified extra-axial structure over the right frontal region. Postsurgical defect over the right frontoparietal skull unchanged. No evidence of acute hemorrhage or acute infarction. No significant midline shift.  CT CERVICAL SPINE FINDINGS  There straightening of the normal cervical lordosis. There is mild spondylosis throughout the cervical spine. There is disc space narrowing from the C5-6 level to the C7-T1 level unchanged. Prevertebral soft tissues are within normal. Atlantoaxial articulation is normal. No acute fracture or subluxation. Remainder the exam is unchanged.  IMPRESSION: Since the prior study from 08/08/2013, there has been interval enlargement of the ventricles. There are also findings suggesting fracture of the intracranial segment of the previously seen left frontal ventriculostomy catheter as this segment of catheter now lies within the mid to posterior body of the right lateral ventricle.  Other chronic and postsurgical findings unchanged.  No acute cervical spine injury.  Mild spondylosis of the cervical spine with disc disease from the C5-6 level to the C7-T1 level.  These results were called by telephone at the time of interpretation on 02/07/2015 at 1:18 pm to Dr. Lorre Nick , who verbally acknowledged these results.   Electronically Signed   By: Elberta Fortis M.D.   On: 02/07/2015 13:19     Assessment/Plan: 64 year old female with complex history with multiple VP shunts L has ventriculomegaly, probably symptomatic though in no extremis, with a broken left frontal shunt and probably a poorly working left parietal shunt. She probably does need a new VP shunt. The only place to try to put it is in the right parietal region given the findings on her scan. I have described this to the family in detail. I will likely need  general surgery to help with the peritoneal placement of the distal catheter. We will try to schedule this for 2 days from now, and I have spoken to general surgery regarding help. She will be admitted and watched until the time for surgery. If she were to deteriorate, which I think is unlikely, then I would place a ventriculostomy until VP shunt can be placed versus do the shunt more urgently.   Maureen Ortiz S 02/07/2015 5:08 PM

## 2015-02-08 LAB — URINE CULTURE

## 2015-02-08 LAB — PROTIME-INR
INR: 1.11 (ref 0.00–1.49)
Prothrombin Time: 14.5 seconds (ref 11.6–15.2)

## 2015-02-08 NOTE — Progress Notes (Signed)
Pt awake alert and stable. For VPS tomorrow

## 2015-02-08 NOTE — Progress Notes (Signed)
Patient arrived to 4N17 alert & oriented at [id number].    Had some anxiety earlier in shift has since calmed down.  Will continue to monitor.

## 2015-02-08 NOTE — Progress Notes (Signed)
Patient signed consent form for VP Shunt replacement this morning no complaints voiced.

## 2015-02-09 ENCOUNTER — Inpatient Hospital Stay (HOSPITAL_COMMUNITY): Payer: Medicare Other | Admitting: Anesthesiology

## 2015-02-09 ENCOUNTER — Encounter (HOSPITAL_COMMUNITY)
Admission: EM | Disposition: A | Discharge status: Skilled Nursing Facility | Payer: Self-pay | Source: Home / Self Care | Attending: Neurological Surgery

## 2015-02-09 ENCOUNTER — Encounter (HOSPITAL_COMMUNITY): Payer: Self-pay | Admitting: Anesthesiology

## 2015-02-09 DIAGNOSIS — Z982 Presence of cerebrospinal fluid drainage device: Secondary | ICD-10-CM

## 2015-02-09 HISTORY — PX: LAPAROSCOPIC REVISION VENTRICULAR-PERITONEAL (V-P) SHUNT: SHX5924

## 2015-02-09 HISTORY — PX: VENTRICULOPERITONEAL SHUNT: SHX204

## 2015-02-09 SURGERY — SHUNT INSERTION VENTRICULAR-PERITONEAL
Anesthesia: General | Site: Abdomen | Laterality: Right

## 2015-02-09 MED ORDER — STERILE WATER FOR INJECTION IJ SOLN
INTRAMUSCULAR | Status: AC
  Filled 2015-02-09: qty 10

## 2015-02-09 MED ORDER — ONDANSETRON HCL 4 MG PO TABS
4.0000 mg | ORAL_TABLET | ORAL | Status: DC | PRN
Start: 2015-02-09 — End: 2015-02-15

## 2015-02-09 MED ORDER — PROPOFOL 10 MG/ML IV BOLUS
INTRAVENOUS | Status: DC | PRN
  Administered 2015-02-09: 150 mg via INTRAVENOUS

## 2015-02-09 MED ORDER — ARTIFICIAL TEARS OP OINT
TOPICAL_OINTMENT | OPHTHALMIC | Status: DC | PRN
  Administered 2015-02-09: 1 via OPHTHALMIC

## 2015-02-09 MED ORDER — POTASSIUM CHLORIDE IN NACL 20-0.9 MEQ/L-% IV SOLN
INTRAVENOUS | Status: DC
  Administered 2015-02-09 – 2015-02-15 (×8): via INTRAVENOUS
  Filled 2015-02-09 (×13): qty 1000

## 2015-02-09 MED ORDER — SODIUM CHLORIDE 0.9 % IV SOLN
INTRAVENOUS | Status: DC | PRN
  Administered 2015-02-09 (×2): via INTRAVENOUS

## 2015-02-09 MED ORDER — NEOSTIGMINE METHYLSULFATE 10 MG/10ML IV SOLN
INTRAVENOUS | Status: DC | PRN
  Administered 2015-02-09: 4 mg via INTRAVENOUS

## 2015-02-09 MED ORDER — CEFAZOLIN SODIUM 1-5 GM-% IV SOLN
1.0000 g | Freq: Three times a day (TID) | INTRAVENOUS | Status: AC
  Administered 2015-02-10 (×2): 1 g via INTRAVENOUS
  Filled 2015-02-09 (×2): qty 50

## 2015-02-09 MED ORDER — PROMETHAZINE HCL 25 MG/ML IJ SOLN: 6.2500 mg | INTRAMUSCULAR | Status: DC | PRN

## 2015-02-09 MED ORDER — THROMBIN 5000 UNITS EX SOLR
CUTANEOUS | Status: DC | PRN
  Administered 2015-02-09 (×2): 5000 [IU] via TOPICAL

## 2015-02-09 MED ORDER — GLYCOPYRROLATE 0.2 MG/ML IJ SOLN
INTRAMUSCULAR | Status: AC
  Filled 2015-02-09: qty 3

## 2015-02-09 MED ORDER — ONDANSETRON HCL 4 MG/2ML IJ SOLN
INTRAMUSCULAR | Status: DC | PRN
  Administered 2015-02-09: 4 mg via INTRAVENOUS

## 2015-02-09 MED ORDER — ACETAMINOPHEN 650 MG RE SUPP: 650.0000 mg | RECTAL | Status: DC | PRN

## 2015-02-09 MED ORDER — SODIUM CHLORIDE 0.9 % IR SOLN
Status: DC | PRN
  Administered 2015-02-09: 500 mL

## 2015-02-09 MED ORDER — CEFAZOLIN SODIUM-DEXTROSE 2-3 GM-% IV SOLR
INTRAVENOUS | Status: DC | PRN
  Administered 2015-02-09: 2 g via INTRAVENOUS

## 2015-02-09 MED ORDER — NEOSTIGMINE METHYLSULFATE 10 MG/10ML IV SOLN
INTRAVENOUS | Status: AC
  Filled 2015-02-09: qty 1

## 2015-02-09 MED ORDER — LIDOCAINE-EPINEPHRINE 1 %-1:100000 IJ SOLN
INTRAMUSCULAR | Status: DC | PRN
  Administered 2015-02-09: 13 mL via INTRADERMAL

## 2015-02-09 MED ORDER — ACETAMINOPHEN 325 MG PO TABS: 650.0000 mg | ORAL_TABLET | ORAL | Status: DC | PRN

## 2015-02-09 MED ORDER — PANTOPRAZOLE SODIUM 40 MG IV SOLR
40.0000 mg | Freq: Every day | INTRAVENOUS | Status: DC
  Administered 2015-02-09 – 2015-02-11 (×3): 40 mg via INTRAVENOUS
  Filled 2015-02-09 (×5): qty 40

## 2015-02-09 MED ORDER — GLYCOPYRROLATE 0.2 MG/ML IJ SOLN
INTRAMUSCULAR | Status: DC | PRN
  Administered 2015-02-09: 0.6 mg via INTRAVENOUS

## 2015-02-09 MED ORDER — LIDOCAINE HCL (CARDIAC) 20 MG/ML IV SOLN
INTRAVENOUS | Status: DC | PRN
  Administered 2015-02-09: 40 mg via INTRAVENOUS

## 2015-02-09 MED ORDER — EPHEDRINE SULFATE 50 MG/ML IJ SOLN
INTRAMUSCULAR | Status: DC | PRN
  Administered 2015-02-09 (×3): 10 mg via INTRAVENOUS

## 2015-02-09 MED ORDER — FENTANYL CITRATE (PF) 100 MCG/2ML IJ SOLN: 25.0000 ug | INTRAMUSCULAR | Status: DC | PRN

## 2015-02-09 MED ORDER — MORPHINE SULFATE 2 MG/ML IJ SOLN
1.0000 mg | INTRAMUSCULAR | Status: DC | PRN
  Administered 2015-02-09 – 2015-02-10 (×2): 2 mg via INTRAVENOUS
  Administered 2015-02-10: 1 mg via INTRAVENOUS
  Administered 2015-02-10 – 2015-02-13 (×3): 2 mg via INTRAVENOUS
  Filled 2015-02-09 (×6): qty 1

## 2015-02-09 MED ORDER — LABETALOL HCL 5 MG/ML IV SOLN
10.0000 mg | INTRAVENOUS | Status: DC | PRN
  Administered 2015-02-09 – 2015-02-14 (×3): 10 mg via INTRAVENOUS
  Filled 2015-02-09 (×3): qty 4

## 2015-02-09 MED ORDER — FENTANYL CITRATE (PF) 100 MCG/2ML IJ SOLN
INTRAMUSCULAR | Status: DC | PRN
  Administered 2015-02-09 (×5): 50 ug via INTRAVENOUS

## 2015-02-09 MED ORDER — 0.9 % SODIUM CHLORIDE (POUR BTL) OPTIME
TOPICAL | Status: DC | PRN
  Administered 2015-02-09: 1000 mL

## 2015-02-09 MED ORDER — ONDANSETRON HCL 4 MG/2ML IJ SOLN
4.0000 mg | INTRAMUSCULAR | Status: DC | PRN
  Administered 2015-02-11 (×2): 4 mg via INTRAVENOUS
  Filled 2015-02-09 (×2): qty 2

## 2015-02-09 MED ORDER — ETOMIDATE 2 MG/ML IV SOLN
INTRAVENOUS | Status: AC
  Filled 2015-02-09: qty 20

## 2015-02-09 MED ORDER — ROCURONIUM BROMIDE 100 MG/10ML IV SOLN
INTRAVENOUS | Status: DC | PRN
  Administered 2015-02-09: 40 mg via INTRAVENOUS

## 2015-02-09 MED ORDER — ROCURONIUM BROMIDE 50 MG/5ML IV SOLN
INTRAVENOUS | Status: AC
  Filled 2015-02-09: qty 1

## 2015-02-09 MED ORDER — HEMOSTATIC AGENTS (NO CHARGE) OPTIME
TOPICAL | Status: DC | PRN
  Administered 2015-02-09: 1 via TOPICAL

## 2015-02-09 MED ORDER — MEPERIDINE HCL 25 MG/ML IJ SOLN: 6.2500 mg | INTRAMUSCULAR | Status: DC | PRN

## 2015-02-09 SURGICAL SUPPLY — 80 items
APL SKNCLS STERI-STRIP NONHPOA (GAUZE/BANDAGES/DRESSINGS) ×4
BAG DECANTER FOR FLEXI CONT (MISCELLANEOUS) ×3 IMPLANT
BENZOIN TINCTURE PRP APPL 2/3 (GAUZE/BANDAGES/DRESSINGS) ×6 IMPLANT
BLADE CLIPPER SURG NEURO (BLADE) ×3 IMPLANT
BLADE SURG 15 STRL LF DISP TIS (BLADE) ×2 IMPLANT
BLADE SURG 15 STRL SS (BLADE) ×3
BUR ACORN 6.0 PRECISION (BURR) ×3 IMPLANT
CANISTER SUCT 3000ML PPV (MISCELLANEOUS) ×3 IMPLANT
CLIP RANEY DISP (INSTRUMENTS) IMPLANT
CONT SPEC 4OZ CLIKSEAL STRL BL (MISCELLANEOUS) IMPLANT
DECANTER SPIKE VIAL GLASS SM (MISCELLANEOUS) ×3 IMPLANT
DRAPE INCISE IOBAN 66X45 STRL (DRAPES) ×6 IMPLANT
DRAPE ORTHO SPLIT 77X108 STRL (DRAPES) ×6
DRAPE POUCH INSTRU U-SHP 10X18 (DRAPES) ×3 IMPLANT
DRAPE SURG 17X23 STRL (DRAPES) IMPLANT
DRAPE SURG ORHT 6 SPLT 77X108 (DRAPES) ×4 IMPLANT
DRSG OPSITE 4X5.5 SM (GAUZE/BANDAGES/DRESSINGS) ×6 IMPLANT
DRSG TELFA 3X8 NADH (GAUZE/BANDAGES/DRESSINGS) ×3 IMPLANT
ELECT REM PT RETURN 9FT ADLT (ELECTROSURGICAL) ×3
ELECTRODE REM PT RTRN 9FT ADLT (ELECTROSURGICAL) ×2 IMPLANT
GAUZE SPONGE 2X2 8PLY STRL LF (GAUZE/BANDAGES/DRESSINGS) IMPLANT
GAUZE SPONGE 4X4 12PLY STRL (GAUZE/BANDAGES/DRESSINGS) ×3 IMPLANT
GAUZE SPONGE 4X4 16PLY XRAY LF (GAUZE/BANDAGES/DRESSINGS) ×3 IMPLANT
GLOVE BIO SURGEON STRL SZ 6.5 (GLOVE) ×6 IMPLANT
GLOVE BIO SURGEON STRL SZ8 (GLOVE) ×3 IMPLANT
GLOVE BIOGEL M 7.0 STRL (GLOVE) ×3 IMPLANT
GLOVE BIOGEL PI IND STRL 8 (GLOVE) ×2 IMPLANT
GLOVE BIOGEL PI INDICATOR 8 (GLOVE) ×1
GLOVE ECLIPSE 6.5 STRL STRAW (GLOVE) ×3 IMPLANT
GLOVE ECLIPSE 8.0 STRL XLNG CF (GLOVE) ×3 IMPLANT
GLOVE INDICATOR 7.0 STRL GRN (GLOVE) ×3 IMPLANT
GLOVE SURG SS PI 6.5 STRL IVOR (GLOVE) ×3 IMPLANT
GOWN STRL NON-REIN LRG LVL3 (GOWN DISPOSABLE) ×6 IMPLANT
GOWN STRL REUS W/ TWL LRG LVL3 (GOWN DISPOSABLE) IMPLANT
GOWN STRL REUS W/ TWL XL LVL3 (GOWN DISPOSABLE) IMPLANT
GOWN STRL REUS W/TWL 2XL LVL3 (GOWN DISPOSABLE) ×3 IMPLANT
GOWN STRL REUS W/TWL LRG LVL3 (GOWN DISPOSABLE)
GOWN STRL REUS W/TWL XL LVL3 (GOWN DISPOSABLE)
KIT BASIN OR (CUSTOM PROCEDURE TRAY) ×3 IMPLANT
KIT ROOM TURNOVER OR (KITS) ×3 IMPLANT
LIQUID BAND (GAUZE/BANDAGES/DRESSINGS) ×3 IMPLANT
NEEDLE BLUNT 16X1.5 OR ONLY (NEEDLE) IMPLANT
NEEDLE HYPO 25X1 1.5 SAFETY (NEEDLE) ×3 IMPLANT
NS IRRIG 1000ML POUR BTL (IV SOLUTION) ×3 IMPLANT
PACK LAMINECTOMY NEURO (CUSTOM PROCEDURE TRAY) ×3 IMPLANT
PAD ARMBOARD 7.5X6 YLW CONV (MISCELLANEOUS) ×9 IMPLANT
PATTIES SURGICAL .5 X.5 (GAUZE/BANDAGES/DRESSINGS) IMPLANT
PATTIES SURGICAL .5 X3 (DISPOSABLE) IMPLANT
PATTIES SURGICAL .75X.75 (GAUZE/BANDAGES/DRESSINGS) IMPLANT
RUBBERBAND STERILE (MISCELLANEOUS) IMPLANT
SHEATH COOK PEEL AWAY SET 9F (SHEATH) ×3 IMPLANT
SHEATH PERITONEAL INTRO 46 (MISCELLANEOUS) IMPLANT
SHEATH PERITONEAL INTRO 61 (MISCELLANEOUS) ×3 IMPLANT
SLEEVE ENDOPATH XCEL 5M (ENDOMECHANICALS) ×3 IMPLANT
SPONGE GAUZE 2X2 STER 10/PKG (GAUZE/BANDAGES/DRESSINGS)
SPONGE LAP 4X18 X RAY DECT (DISPOSABLE) IMPLANT
SPONGE SURGIFOAM ABS GEL SZ50 (HEMOSTASIS) ×3 IMPLANT
STAPLER VISISTAT 35W (STAPLE) ×3 IMPLANT
STRIP CLOSURE SKIN 1/2X4 (GAUZE/BANDAGES/DRESSINGS) ×6 IMPLANT
SUT ETHILON 3 0 FSL (SUTURE) IMPLANT
SUT MON AB 4-0 PC3 18 (SUTURE) ×3 IMPLANT
SUT SILK 0 TIES 10X30 (SUTURE) IMPLANT
SUT SILK 2 0 TIES 17X18 (SUTURE) ×3
SUT SILK 2-0 18XBRD TIE BLK (SUTURE) ×2 IMPLANT
SUT SILK 3 0 SH 30 (SUTURE) IMPLANT
SUT VIC AB 2-0 CP2 18 (SUTURE) ×6 IMPLANT
SUT VIC AB 3-0 SH 8-18 (SUTURE) ×3 IMPLANT
SUT VICRYL 0 UR6 27IN ABS (SUTURE) ×3 IMPLANT
SYR 5ML LL (SYRINGE) IMPLANT
SYR CONTROL 10ML LL (SYRINGE) ×6 IMPLANT
SYR TB 1ML 25GX5/8 (SYRINGE) IMPLANT
TAPE CLOTH SURG 4X10 WHT LF (GAUZE/BANDAGES/DRESSINGS) ×3 IMPLANT
TOWEL OR 17X24 6PK STRL BLUE (TOWEL DISPOSABLE) ×3 IMPLANT
TOWEL OR 17X26 10 PK STRL BLUE (TOWEL DISPOSABLE) ×3 IMPLANT
TRAY FOLEY W/METER SILVER 14FR (SET/KITS/TRAYS/PACK) IMPLANT
TROCAR XCEL BLUNT TIP 100MML (ENDOMECHANICALS) IMPLANT
TROCAR XCEL NON-BLD 5MMX100MML (ENDOMECHANICALS) ×3 IMPLANT
UNDERPAD 30X30 INCONTINENT (UNDERPADS AND DIAPERS) ×3 IMPLANT
VALVE RT ANGLE UNITIZE DIST (Valve) ×3 IMPLANT
WATER STERILE IRR 1000ML POUR (IV SOLUTION) ×3 IMPLANT

## 2015-02-09 NOTE — Consult Note (Signed)
  Maureen Ortiz has had many ventriculoperitoneal shunts. She now has worsening hydrocephalus and Dr. Marikay Alaravid Jones is proceeding with new VP shunt placement. He asked me to perform the abdominal portion in light of her history. I spoke with her daughter and husband in the pre-op holding area and explained my portion of the procedure. I plan laparoscopic exploration and peritoneal placement of the abdominal end of the catheter. We discussed the risks and benefits and they agree.  Filed Vitals:   02/09/15 1429  BP: 153/96  Pulse: 81  Temp: 97.5 F (36.4 C)  Resp: 18  Awake, speech with mild slurring and some word finding CV RRR Abdomen soft, NT  Will proceed to the OR.  Violeta GelinasBurke Jazmen Lindenbaum, MD, MPH, FACS Trauma: [phone] General Surgery: [phone]

## 2015-02-09 NOTE — Anesthesia Procedure Notes (Signed)
Procedure Name: Intubation Date/Time: 02/09/2015 6:54 PM Performed by: Brien MatesMAHONY, Cayci Mcnabb D Pre-anesthesia Checklist: Patient identified, Emergency Drugs available, Suction available and Timeout performed Patient Re-evaluated:Patient Re-evaluated prior to inductionOxygen Delivery Method: Circle system utilized Preoxygenation: Pre-oxygenation with 100% oxygen Intubation Type: IV induction Ventilation: Mask ventilation without difficulty Laryngoscope Size: Miller and 2 Grade View: Grade I Tube type: Oral Tube size: 7.5 mm Number of attempts: 1 Airway Equipment and Method: Stylet Placement Confirmation: ETT inserted through vocal cords under direct vision,  positive ETCO2 and breath sounds checked- equal and bilateral Secured at: 21 cm Tube secured with: Tape Dental Injury: Teeth and Oropharynx as per pre-operative assessment

## 2015-02-09 NOTE — Op Note (Addendum)
02/07/2015 - 02/09/2015  8:00 PM  PATIENT:  Maureen CrockerSarah R Rosello  64 y.o. female  PRE-OPERATIVE DIAGNOSIS:  Hydrocephalus  POST-OPERATIVE DIAGNOSIS:  Hydrocephalus  PROCEDURE:  Procedure(s): SHUNT INSERTION VENTRICULAR-PERITONEAL LAPAROSCOPIC PLACEMENT VENTRICULAR-PERITONEAL (V-P) SHUNT  SURGEON:  Violeta GelinasBurke Kobie Whidby, MD. Marikay Alaravid Jones, MD (Co-surgeons)  ANESTHESIA:   local and general  EBL:  Total I/O In: 500 [I.V.:500] Out: -   BLOOD ADMINISTERED:none  DRAINS: none   SPECIMEN:  No Specimen  DISPOSITION OF SPECIMEN:  N/A  COUNTS:  YES  DICTATION: Reubin Milan.Dragon Dictation  Ms. Maureen Ortiz was identified in the preop holding area. Informed consent was obtained. She was brought to the operative room and general endotracheal anesthesia was administered by the anesthesia staff. She was prepped and draped in a sterile fashion. Time out procedure was performed. She received intravenous antibiotics. Dr. Yetta BarreJones proceeded with the ventricular portion. I proceeded with the peritoneal portion. Infraumbilical region was infiltrated with local. Infraumbilical incision was made. Subcutaneous tissues were dissected down revealing the anterior fascia. This was divided sharply along the midline. The peritoneum was then gently entered under direct vision. There was some scar tissue in that area. Once the peritoneum was entered it was free. A 0 Vicryl was placed on the fascial opening and a Hassan trocar was inserted. Abdomen was insufflated with carbon monoxide in standard fashion. Laparoscopic exploration revealed no complications of port insertion. There were areas of scar tissue especially in the right upper quadrant and left upper quadrant. There were also veils of thickened peritoneum over the bowel in some areas. An area in the medial right upper quadrant was selected for insertion of the shunt. A small incision was made there and Dr. Yetta BarreJones proceeded with the tunneling portion. A 5 mm port was placed in the left  midabdomen to facilitate positioning of the shunt. Dr. Yetta BarreJones completed the ventricular portion and tunneled the catheter. I made a small opening in the fascia into the peritoneum under direct vision and the catheter was easily threaded into the abdomen. It was positioned laparoscopic using a Glassman. It laid nicely into the peritoneum. Ports were removed. Pneumoperitoneum was released. Informed local fascia was closed by tying the pursestring suture. Each wound was irrigated and closed with 3-0 Vicryl subcuticular sutures followed by Dermabond. All counts were correct. There were no apparent complications. She was taken recovery in stable condition.  PATIENT DISPOSITION:  PACU - hemodynamically stable.  Violeta GelinasBurke Maureen Gildner, MD, MPH, FACS Pager: [phone]  5/27/20168:00 PM

## 2015-02-09 NOTE — Anesthesia Preprocedure Evaluation (Addendum)
Anesthesia Evaluation  Patient identified by MRN, date of birth, ID band Patient awake    Reviewed: Allergy & Precautions, NPO status , Patient's Chart, lab work & pertinent test results, reviewed documented beta blocker date and time   Airway Mallampati: II   Neck ROM: full    Dental   Pulmonary neg pulmonary ROS,  breath sounds clear to auscultation        Cardiovascular hypertension, Pt. on medications negative cardio ROS  Rhythm:regular Rate:Normal  EKG 01/2015 borderline  Right axis   Neuro/Psych Depression Multiple VP shunts in past.  Shunt now malfunctioning.  Plan is for Right parieatal shunt placement.  Significant dysphagia    GI/Hepatic negative GI ROS, Neg liver ROS,   Endo/Other  negative endocrine ROS  Renal/GU negative Renal ROS     Musculoskeletal   Abdominal   Peds  Hematology 12/38   Anesthesia Other Findings   Reproductive/Obstetrics                           Anesthesia Physical Anesthesia Plan  ASA: III  Anesthesia Plan: General   Post-op Pain Management:    Induction: Intravenous  Airway Management Planned: Oral ETT  Additional Equipment:   Intra-op Plan:   Post-operative Plan: Extubation in OR  Informed Consent: I have reviewed the patients History and Physical, chart, labs and discussed the procedure including the risks, benefits and alternatives for the proposed anesthesia with the patient or authorized representative who has indicated his/her understanding and acceptance.     Plan Discussed with:   Anesthesia Plan Comments: (Careful extubation with history of dysphagia and swallowing difficulty, also significant fall risk post op.)        Anesthesia Quick Evaluation

## 2015-02-09 NOTE — Op Note (Signed)
02/07/2015 - 02/09/2015  8:04 PM  PATIENT:  Maureen CrockerSarah R Dougal  64 y.o. female  PRE-OPERATIVE DIAGNOSIS:  Hydrocephalus  POST-OPERATIVE DIAGNOSIS:  Same  PROCEDURE:  Right ventriculoperitoneal shunt placement with a Hakim programmable valve set at 120 mm water pressure  SURGEON:  Marikay Alaravid Brigetta Beckstrom, MD  Co-surgeon: Dr. Violeta GelinasBurke Thompson  ASSISTANTS: Dr. Franky Machoabbell  ANESTHESIA:   General  EBL: Less than 25 ml  Total I/O In: 1000 [I.V.:1000] Out: -   BLOOD ADMINISTERED:none  DRAINS: None   SPECIMEN:  No Specimen  INDICATION FOR PROCEDURE: This patient has had multiple shunts placed in the remote past. She was admitted with increasing confusion and aphasia. CT scan showed a broken left frontal shunt and increasing hydrocephalus. Recommended right ventricular peritoneal shunt placement. Patient understood the risks, benefits, and alternatives and potential outcomes and wished to proceed.  PROCEDURE DETAILS:  The patient was taken to the operating room and after induction of adequate general endotracheal anesthesia she was placed in the supine position on the table. Her right parietal and occipital  region was shaved. She was then cleaned with Betadine scrub and then prepped with DuraPrep and the right parietal region to include the entire abdomen. Dr. Janee Mornhompson of Gen. surgery performed the peritoneal exposure and this will be dictated in a separate note. Local anesthesia was injected and a curvilinear incision was made in the right parietal region. A small burr hole was made with the high-speed air power drill. A pocket was created for the valve. We passed the shunt passer from the abdominal incision to the cranial incision and then passed the distal catheter through the tunnel. The dura was opened. It took several passes of the proximal catheter to obtain good flow of CSF under mild pressure. The catheter was cut at about 9 cm of length and attached to the [place]ham reservoir. It was tied into place  with a silk suture. The valve had been set at 120 mm of water pressure. Had good flow through the distal catheter. The distal catheter was then placed in the peritoneum by Dr. Janee Mornhompson. The incisions were then closed with 20 Vicryls and the galea and tables and the skin the cranial incision. The abdominal incisions were closed by Dr. Janee Mornhompson. At the end of the procedure all sponge needle and instrument counts were correct. She was awakened from general anesthesia and transported to the recovery room in stable condition.   PLAN OF CARE: Admit to inpatient   PATIENT DISPOSITION:  ICU - extubated and stable.   Delay start of Pharmacological VTE agent (>24hrs) due to surgical blood loss or risk of bleeding:  yes

## 2015-02-09 NOTE — Anesthesia Postprocedure Evaluation (Signed)
  Anesthesia Post-op Note  Patient: Maureen CrockerSarah R Ortiz  Procedure(s) Performed: Procedure(s): SHUNT INSERTION VENTRICULAR-PERITONEAL (Right) LAPAROSCOPIC REVISION VENTRICULAR-PERITONEAL (V-P) SHUNT (Right)  Patient Location: PACU  Anesthesia Type:General  Level of Consciousness: awake and alert   Airway and Oxygen Therapy: Patient Spontanous Breathing  Post-op Pain: none  Post-op Assessment: Post-op Vital signs reviewed, Patient's Cardiovascular Status Stable and Respiratory Function Stable  Post-op Vital Signs: Reviewed  Filed Vitals:   02/09/15 2100  BP: 137/88  Pulse:   Temp: 36.6 C  Resp:     Complications: No apparent anesthesia complications

## 2015-02-09 NOTE — Transfer of Care (Signed)
Immediate Anesthesia Transfer of Care Note  Patient: Maureen CrockerSarah R Vanbrocklin  Procedure(s) Performed: Procedure(s): SHUNT INSERTION VENTRICULAR-PERITONEAL (Right) LAPAROSCOPIC REVISION VENTRICULAR-PERITONEAL (V-P) SHUNT (Right)  Patient Location: PACU  Anesthesia Type:General  Level of Consciousness: sedated  Airway & Oxygen Therapy: Patient Spontanous Breathing and Patient connected to face mask oxygen  Post-op Assessment: Report given to RN and Post -op Vital signs reviewed and stable  Post vital signs: Reviewed and stable  Last Vitals:  Filed Vitals:   02/09/15 1429  BP: 153/96  Pulse: 81  Temp: 36.4 C  Resp: 18    Complications: No apparent anesthesia complications

## 2015-02-10 ENCOUNTER — Inpatient Hospital Stay (HOSPITAL_COMMUNITY): Payer: Medicare Other

## 2015-02-10 ENCOUNTER — Encounter (HOSPITAL_COMMUNITY): Payer: Self-pay | Admitting: Neurological Surgery

## 2015-02-10 LAB — GLUCOSE, CAPILLARY
GLUCOSE-CAPILLARY: 129 mg/dL — AB (ref 65–99)
GLUCOSE-CAPILLARY: 103 mg/dL — AB (ref 65–99)
Glucose-Capillary: 115 mg/dL — ABNORMAL HIGH (ref 65–99)
Glucose-Capillary: 95 mg/dL (ref 65–99)
Glucose-Capillary: 106 mg/dL — ABNORMAL HIGH (ref 65–99)
Glucose-Capillary: 105 mg/dL — ABNORMAL HIGH (ref 65–99)
Glucose-Capillary: 97 mg/dL (ref 65–99)

## 2015-02-10 NOTE — Progress Notes (Signed)
Patient ID: Maureen CrockerSarah R Ortiz, female   DOB: 08/27/1951, 64 y.o.   MRN: [id number] BP 167/92 mmHg  Pulse 101  Temp(Src) 97.9 F (36.6 C) (Oral)  Resp 17  Ht 5\' 6"  (1.676 m)  Wt 61.1 kg (134 lb 11.2 oz)  BMI 21.75 kg/m2  SpO2 94% Head ct shows no ICH, catheter is well within the ventricle. Do believe she will improve with time.

## 2015-02-10 NOTE — Progress Notes (Signed)
Patient ID: Maureen CrockerSarah R Ortiz, female   DOB: 07/03/1951, 64 y.o.   MRN: [id number] BP 123/75 mmHg  Pulse 83  Temp(Src) 97.7 F (36.5 C) (Axillary)  Resp 15  Ht 5\' 6"  (1.676 m)  Wt 61.1 kg (134 lb 11.2 oz)  BMI 21.75 kg/m2  SpO2 93% Alert, +/- tracking, does not appear to see. Is not speaking Moving all extremities Wounds are clean and dry.  Daughter states her mother is by no means at bAseline. Usually she speaks, follows commands. She has been somewhat aphasic recently

## 2015-02-10 NOTE — Progress Notes (Signed)
1 Day Post-Op  Subjective: PT AWAKE denies abdominal issues  Objective: Vital signs in last 24 hours: Temp:  [97.5 F (36.4 C)-97.8 F (36.6 C)] 97.7 F (36.5 C) (05/28 0400) Pulse Rate:  [67-103] 83 (05/28 0700) Resp:  [11-26] 15 (05/28 0700) BP: (118-171)/(66-96) 123/75 mmHg (05/28 0700) SpO2:  [93 %-100 %] 93 % (05/28 0700) Last BM Date: 02/08/15  Intake/Output from previous day: 05/27 0701 - 05/28 0700 In: 1700 [I.V.:1650; IV Piggyback:50] Out: 10 [Blood:10] Intake/Output this shift:    Incision/Wound:port sites CDI soft nt   Lab Results:   Recent Labs  02/07/15 1140  WBC 10.2  HGB 12.1  HCT 38.4  PLT 345   BMET  Recent Labs  02/07/15 1140  NA 138  K 3.7  CL 100*  CO2 26  GLUCOSE 129*  BUN 13  CREATININE 0.76  CALCIUM 9.6   PT/INR  Recent Labs  02/08/15 0451  LABPROT 14.5  INR 1.11   ABG No results for input(s): PHART, HCO3 in the last 72 hours.  Invalid input(s): PCO2, PO2  Studies/Results: No results found.  Anti-infectives: Anti-infectives    Start     Dose/Rate Route Frequency Ordered Stop   02/10/15 0230  ceFAZolin (ANCEF) IVPB 1 g/50 mL premix     1 g 100 mL/hr over 30 Minutes Intravenous Every 8 hours 02/09/15 2117 02/10/15 1829   02/09/15 1855  bacitracin 50,000 Units in sodium chloride irrigation 0.9 % 500 mL irrigation  Status:  Discontinued       As needed 02/09/15 1924 02/09/15 2010      Assessment/Plan: s/p Procedure(s): SHUNT INSERTION VENTRICULAR-PERITONEAL (Right) LAPAROSCOPIC REVISION VENTRICULAR-PERITONEAL (V-P) SHUNT (Right) STABLE  ADV ANCE DIET AS TOLERATED   LOS: 3 days    Maureen Ortiz A. 02/10/2015

## 2015-02-11 LAB — GLUCOSE, CAPILLARY
GLUCOSE-CAPILLARY: 88 mg/dL (ref 65–99)
GLUCOSE-CAPILLARY: 88 mg/dL (ref 65–99)
Glucose-Capillary: 95 mg/dL (ref 65–99)
Glucose-Capillary: 97 mg/dL (ref 65–99)
Glucose-Capillary: 104 mg/dL — ABNORMAL HIGH (ref 65–99)
Glucose-Capillary: 88 mg/dL (ref 65–99)

## 2015-02-11 NOTE — Progress Notes (Signed)
Patient ID: Lahoma CrockerSarah R Ortiz, female   DOB: 08/10/1951, 64 y.o.   MRN: [id number] Stable. No neuro changes. Talks some. Plan oob,PT

## 2015-02-12 LAB — GLUCOSE, CAPILLARY: Glucose-Capillary: 83 mg/dL (ref 65–99)

## 2015-02-12 MED ORDER — PANTOPRAZOLE SODIUM 40 MG PO TBEC
40.0000 mg | DELAYED_RELEASE_TABLET | Freq: Every day | ORAL | Status: DC
  Administered 2015-02-12 – 2015-02-14 (×3): 40 mg via ORAL
  Filled 2015-02-12 (×3): qty 1

## 2015-02-12 MED ORDER — WHITE PETROLATUM GEL
Status: AC
  Filled 2015-02-12: qty 1

## 2015-02-12 NOTE — Clinical Social Work Note (Signed)
Clinical Social Work Assessment  Patient Details  Name: Maureen CrockerSarah R Ortiz MRN: [id number] Date of Birth: 11/14/1950  Date of referral:  02/12/15               Reason for consult:  Facility Placement                Permission sought to share information with:  Facility Medical sales representativeContact Representative, Family Supports Permission granted to share information::  No (pt is not oriented- CSW contact pt father on contact list)  Name::     Financial controllerAngela  [place]  [facility] [place] [place] [facility]  Relationship::  sister  SolicitorContact Information:  [place]: [phone]  Housing/Transportation Living arrangements for the past 2 months:  [facility] Source of Information:  Parent Patient Interpreter Needed:  None Criminal Activity/Legal Involvement Pertinent to Current Situation/Hospitalization:  No - Comment as needed Significant Relationships:  Parents, Siblings Lives with:  Facility Resident, Self Do you feel safe going back to the place where you live?  No Need for family participation in patient care:  Yes (Comment)  Care giving concerns:  [facility]: pt father reports that pt is able to do ADLs but is not reliable in taking her medications daily   Social Worker assessment / plan:  CSW spoke with family about PT recommendation for SNF- pt father is agreeable to pt transfer to SNF for short term rehab prior to returning to Ind. Living  Employment status:  Retired Network engineernsurance information:  Managed Care PT Recommendations:  Skilled Nursing Facility Information / Referral to community resources:  Skilled Nursing Facility  Patient/Family's Response to care:  Family is agreeable and understands that pt will not be able to stay in Indp Living forever with her medical concerns  Patient/Family's Understanding of and Emotional Response to Diagnosis, Current Treatment, and Prognosis:  Pt father has good insight into  Pt condition  and is very involved  Emotional Assessment Appearance:   (unable to assess) Attitude/Demeanor/Rapport:  Unable to Assess Affect (typically observed):  Unable to Assess Orientation:  Oriented to Self Alcohol / Substance use:  Not Applicable Psych involvement (Current and /or in the community):  No (Comment)  Discharge Needs  Concerns to be addressed:  Home Safety Concerns, Discharge Planning Concerns Readmission within the last 30 days:  No Current discharge risk:  Physical Impairment, Cognitively Impaired Barriers to Discharge:  Continued Medical Work up   Peabody EnergyHoloman, Sidharth Leverette M, LCSW 02/12/2015, 12:29 PM

## 2015-02-12 NOTE — Evaluation (Signed)
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 She has dysphasia at baseline and frequent falls. Maybe has been sleeping a little more, but denies headache or nausea and vomiting. Much of the communication is through her daughter who works at our hospital. She has seen Dr. Newell CoralNudelman and Dr. Angelyn Puntatter in the past. Her last head CT here was in 2014 which she [facility] Head CT here showed fracture of the left frontal ventricular catheter and increasing ventricular size when compared to her previous head CT. Neurosurgical evaluation was requested. S/P VP shunting.  Clinical Impression  Limited evaluation due to unable to communicate well with pt.  Pt admitted with/for s/s that led to VP shunting.  Pt currently limited functionally due to the problems listed. ( See problems list.)   Pt will benefit from PT to maximize function and safety in order to get ready for next venue listed below.     Follow Up Recommendations SNF    Equipment Recommendations  Other (comment) (TBA)    Recommendations for Other Services       Precautions / Restrictions Precautions Precautions: Fall      Mobility  Bed Mobility Overal bed mobility: Needs Assistance Bed Mobility: Supine to Sit     Supine to sit: Mod assist     General bed mobility comments: pt having difficulty following commands verbally or tactically. R LE held stiffly throoughout.  pt used UE's appropriately.  Needed assist to scoot to EOB  Transfers Overall transfer level: Needs assistance Equipment used: Rolling walker (2 wheeled);1 person hand held assist Transfers: Sit to/from [employer]Stand;Stand Pivot Transfers Sit to Stand: Mod assist;+2 safety/equipment Stand pivot transfers: Mod  assist;+2 safety/equipment;+2 physical assistance       General transfer comment: needed assist to power up,  Prepped feet for good foundation, but they drew in to a narrow BOS on standing..  Pt unable to w/shift well or move her feet to pivot to chair over 3 feet  Ambulation/Gait             General Gait Details: not tested  Stairs            Wheelchair Mobility    Modified Rankin (Stroke Patients Only)       Balance Overall balance assessment: Needs assistance Sitting-balance support: Feet supported;Single extremity supported;Bilateral upper extremity supported Sitting balance-Leahy Scale: Poor Sitting balance - Comments: made purposeful attempts to maintain balance, but failed to maintain sit within 3-5 seconds and fell to the L[place]   Standing balance support: Bilateral upper extremity supported Standing balance-Leahy Scale: Poor                               Pertinent Vitals/Pain Pain Assessment: Faces Faces Pain Scale: No hurt    Home Living Family/patient expects to be discharged to:: Assisted living Living Arrangements: Other (Comment) (help through assisted living.)                    Prior Function                 Hand Dominance        Extremity/Trunk Assessment   Upper Extremity Assessment: Overall  WFL for tasks assessed;Generalized weakness           Lower Extremity Assessment: RLE deficits/detail;LLE deficits/detail RLE Deficits / Details: held rigidly, difficult to break the tone.  Bears weight in stance, but pt has difficulty moving or advancing leg. LLE Deficits / Details: increase tone, moves against gravity, can break through the tone, moves ataxically at best.     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    Exercises        Assessment/Plan    PT Assessment Patient needs continued PT services  PT Diagnosis Difficulty walking;Generalized weakness   PT Problem List Decreased strength;Decreased activity tolerance;Decreased balance;Decreased mobility;Decreased coordination;Decreased cognition;Impaired tone  PT Treatment Interventions Gait training;DME instruction;Functional mobility training;Therapeutic activities;Balance training;Patient/family education;Neuromuscular re-education   PT Goals (Current goals can be found in the Care Plan section) Acute Rehab PT Goals Patient Stated Goal: pt not participative with goals PT Goal Formulation: Patient unable to participate in goal setting Time For Goal Achievement: 02/26/15 Potential to Achieve Goals: Fair    Frequency Min 2X/week   Barriers to discharge        Co-evaluation               End of Session   Activity Tolerance: Patient tolerated treatment well Patient left: in chair;with call bell/phone within reach Nurse Communication: Mobility status         Time: 0953-1020 PT Time Calculation (min) (ACUTE ONLY): 27 min   Charges:   PT Evaluation $Initial PT Evaluation Tier I: 1 Procedure PT Treatments $Therapeutic Activity: 8-22 mins   PT G Codes:        Kirkland Figg, Eliseo Gum 02/12/2015, 10:55 AM  02/12/2015  [place] Bing, PT [phone]  (pager)

## 2015-02-12 NOTE — Clinical Social Work Placement (Signed)
   CLINICAL SOCIAL WORK PLACEMENT  NOTE  Date:  02/12/2015  Patient Details  Name: Maureen CrockerSarah R Ortiz MRN: [id number] Date of Birth: 05/12/1951  Clinical Social Work is seeking post-discharge placement for this patient at the Skilled  Nursing Facility level of care (*CSW will initial, date and re-position this form in  chart as items are completed):  Yes   Patient/family provided with [facility] Clinical Social Work Department's list of facilities offering this level of care within the geographic area requested by the patient (or if unable, by the patient's family).  Yes   Patient/family informed of their freedom to choose among providers that offer the needed level of care, that participate in Medicare, Medicaid or managed care program needed by the patient, have an available bed and are willing to accept the patient.  Yes   Patient/family informed of [facility]'s ownership interest in [facility] and [facility], as well as of the fact that they are under no obligation to receive care at these facilities.  PASRR submitted to EDS on 02/12/15     PASRR number received on 02/12/15     Existing PASRR number confirmed on       FL2 transmitted to all facilities in geographic area requested by pt/family on 02/12/15     FL2 transmitted to all facilities within larger geographic area on       Patient informed that his/her managed care company has contracts with or will negotiate with certain facilities, including the following:            Patient/family informed of bed offers received.  Patient chooses bed at       Physician recommends and patient chooses bed at      Patient to be transferred to   on  .  Patient to be transferred to facility by       Patient family notified on   of transfer.  Name of family member notified:        PHYSICIAN Please sign FL2     Additional Comment:    _______________________________________________ Izora RibasHoloman, Beya Tipps M, LCSW 02/12/2015,  12:50 PM

## 2015-02-12 NOTE — Progress Notes (Signed)
Patient ID: Maureen CrockerSarah R Flye, female   DOB: 08/06/1951, 64 y.o.   MRN: [id number] Stable. Off and on f/c

## 2015-02-12 NOTE — Progress Notes (Signed)
Patient ID: Maureen CrockerSarah R Ortiz, female   DOB: 02/02/1951, 64 y.o.   MRN: [id number] [facility]

## 2015-02-13 NOTE — Progress Notes (Signed)
PT Cancellation Note  Patient Details Name: Maureen CrockerSarah R Ortiz MRN: [id number] DOB: 07/20/1951   Cancelled Treatment:    Reason Eval/Treat Not Completed:  (refused). Pt adamantly refused due to fatigue "I am so tired and wore out." Pt educated on importance of OOB mobility and benefits of PT. Pt con't to refuse. RN notified. PT to return as able.   Marcene BrawnChadwell, Adom Schoeneck Marie 02/13/2015, 1:34 PM   Lewis ShockAshly Tacora Athanas, PT, DPT Pager #: [phone] Office #: [phone]

## 2015-02-13 NOTE — Progress Notes (Signed)
Patients family is requesting a meeting with Dr Yetta BarreJones some time after 1700 today I will leave Dr Yetta BarreJones a message at his office as well.

## 2015-02-13 NOTE — Progress Notes (Signed)
Subjective: Patient not at her baseline. Awake/ alert and looks at me and interacts. [facility]. VPS pumps and refills briskly. Her aphasia seems worse than pre-op.  Objective: Vital signs in last 24 hours: Temp:  [97.4 F (36.3 C)-98.1 F (36.7 C)] 97.4 F (36.3 C) (05/31 0546) Pulse Rate:  [74-89] 80 (05/31 0546) Resp:  [18-19] 18 (05/31 0546) BP: (143-162)/(77-96) 162/86 mmHg (05/31 0546) SpO2:  [91 %-99 %] 99 % (05/31 0546)  Intake/Output from previous day: 05/30 0730 - 05/31 0729 In: 685 [P.O.:10; I.V.:675] Out: -  Intake/Output this shift:    as above  Lab Results: Lab Results  Component Value Date   WBC 10.2 02/07/2015   HGB 12.1 02/07/2015   HCT 38.4 02/07/2015   MCV 76.8* 02/07/2015   PLT 345 02/07/2015   Lab Results  Component Value Date   INR 1.11 02/08/2015   BMET Lab Results  Component Value Date   NA 138 02/07/2015   K 3.7 02/07/2015   CL 100* 02/07/2015   CO2 26 02/07/2015   GLUCOSE 129* 02/07/2015   BUN 13 02/07/2015   CREATININE 0.76 02/07/2015   CALCIUM 9.6 02/07/2015    Studies/Results: No results found.  Assessment/Plan: Continue PT/OT. I thin she will improve to baseline with time, though her level of functioning was declining slowly over months leading up to surgery.    LOS: 6 days    Angelamarie Avakian S 02/13/2015, 8:26 AM

## 2015-02-13 NOTE — Addendum Note (Signed)
Addendum  created 02/13/15 1438 by Leonel RamsayKaren H O'Laughlin, CRNA   Modules edited: Anesthesia Events, Narrator   Narrator:  Narrator: Event Log Edited

## 2015-02-14 NOTE — Progress Notes (Signed)
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 She has dysphasia at baseline and frequent falls. Maybe has been sleeping a little more, but denies headache or nausea and vomiting. Much of the communication is through her daughter who works at our hospital. She has seen Dr. Newell CoralNudelman and Dr. Angelyn Puntatter in the past. Her last head CT here was in 2014 which she [facility] Head CT here showed fracture of the left frontal ventricular catheter and increasing ventricular size when compared to her previous head CT. Neurosurgical evaluation was requested. S/P VP shunting.    PT Comments    Patient tolerated session well, improved motivation this session with sister present. Patient tolerated extensive standing tasks with assist. Session also focuses on sit<>stand transfers. Will continue to see and progress as tolerated.   Follow Up Recommendations  SNF     Equipment Recommendations  Other (comment) (TBA)    Recommendations for Other Services       Precautions / Restrictions Precautions Precautions: Fall Restrictions Weight Bearing Restrictions: No    Mobility  Bed Mobility Overal bed mobility: Needs Assistance Bed Mobility: Supine to Sit     Supine to sit: Mod assist     General bed mobility comments: patient able to initate LE movement to EOB, assist to elevate trunk to upright and rotate hips using chuck pad  Transfers Overall transfer level: Needs assistance Equipment used: Rolling walker (2 wheeled);1 person hand held assist Transfers: Sit to/from Stand Sit to Stand: Mod assist;+2 safety/equipment Stand pivot transfers: Mod assist;+2 safety/equipment;+2 physical assistance       General transfer comment: transfer  training perform with patient x7 during session with standing activities  Ambulation/Gait                 Stairs            Wheelchair Mobility    Modified Rankin (Stroke Patients Only)       Balance   Sitting-balance support: Feet supported Sitting balance-Leahy Scale: Poor Sitting balance - Comments: improved balance at EOB, able to sustain controlled sitting without assist for periods < 1 minute   Standing balance support: Bilateral upper extremity supported;During functional activity Standing balance-Leahy Scale: Poor                      Cognition Arousal/Alertness: Awake/alert Behavior During Therapy: Impulsive;WFL for tasks assessed/performed (it appears difficult for pt to follow commands ) Overall Cognitive Status: History of cognitive impairments - at baseline                      Exercises Other Exercises Other Exercises: session focused primarily on transfer training and dynamic standing activities.  Other Exercises: performed standing weight shiftrs with asssit x10 Other Exercises: performed standing heel raises with increased assist x10 Other Exercises: performed single leg stance (2 sec holds) x 5 Other Exercises: performed marching in standing with assist and minisquats with assist x10    General Comments General comments (skin integrity, edema, etc.): assisted with hygiene and pericare during session, patient able to perform seated hygiene with min guard min assist      Pertinent Vitals/Pain Pain Assessment: No/denies pain Faces Pain Scale: No hurt  Home Living                      Prior Function            PT Goals (current goals can now be found in the care plan section) Acute Rehab PT Goals Patient Stated Goal: pt not participative with goals PT Goal Formulation: Patient unable to participate in goal setting Time For Goal Achievement: 02/26/15 Potential to Achieve Goals: Fair Progress towards PT  goals: Progressing toward goals    Frequency  Min 2X/week    PT Plan Current plan remains appropriate    Co-evaluation             End of Session Equipment Utilized During Treatment: Gait belt Activity Tolerance: Patient tolerated treatment well Patient left: in chair;with call bell/phone within reach     Time: 1028-1052 PT Time Calculation (min) (ACUTE ONLY): 24 min  Charges:  $Therapeutic Exercise: 8-22 mins $Therapeutic Activity: 8-22 mins                    G CodesFabio Asa 2015-03-04, 1:50 PM Charlotte Crumb, PT DPT  [phone]

## 2015-02-14 NOTE — Progress Notes (Signed)
Subjective: Patient looks much better today. She is much more interactive and verbal today. She still has a mild left facial droop. She seems to be moving her extremities well. She is feeding herself.  Objective: Vital signs in last 24 hours: Temp:  [97 F (36.1 C)-98.1 F (36.7 C)] 98 F (36.7 C) (06/01 1006) Pulse Rate:  [78-84] 83 (06/01 1006) Resp:  [18] 18 (06/01 1006) BP: (137-171)/(79-92) 167/92 mmHg (06/01 1006) SpO2:  [97 %-98 %] 98 % (06/01 1006)  Intake/Output from previous day:   Intake/Output this shift:    As above  Lab Results: Lab Results  Component Value Date   WBC 10.2 02/07/2015   HGB 12.1 02/07/2015   HCT 38.4 02/07/2015   MCV 76.8* 02/07/2015   PLT 345 02/07/2015   Lab Results  Component Value Date   INR 1.11 02/08/2015   BMET Lab Results  Component Value Date   NA 138 02/07/2015   K 3.7 02/07/2015   CL 100* 02/07/2015   CO2 26 02/07/2015   GLUCOSE 129* 02/07/2015   BUN 13 02/07/2015   CREATININE 0.76 02/07/2015   CALCIUM 9.6 02/07/2015    Studies/Results: No results found.  Assessment/Plan: Overall and pleased with her progress from yesterday to today. She looks much better today. I had a long discussion with her family today. Awaiting placement.   LOS: 7 days    Brandelyn Henne S 02/14/2015, 11:33 AM

## 2015-02-15 MED ORDER — BISACODYL 10 MG RE SUPP
10.0000 mg | Freq: Every day | RECTAL | Status: DC | PRN
  Administered 2015-02-15: 10 mg via RECTAL
  Filled 2015-02-15: qty 1

## 2015-02-15 NOTE — Clinical Social Work Placement (Signed)
   CLINICAL SOCIAL WORK PLACEMENT  NOTE  Date:  02/15/2015  Patient Details  Name: Maureen Ortiz MRN: [id number] Date of Birth: 10/09/1950  Clinical Social Work is seeking post-discharge placement for this patient at the Skilled  Nursing Facility level of care (*CSW will initial, date and re-position this form in  chart as items are completed):  Yes   Patient/family provided with [facility] Clinical Social Work Department's list of facilities offering this level of care within the geographic area requested by the patient (or if unable, by the patient's family).  Yes   Patient/family informed of their freedom to choose among providers that offer the needed level of care, that participate in Medicare, Medicaid or managed care program needed by the patient, have an available bed and are willing to accept the patient.  Yes   Patient/family informed of [facility] ownership interest in [facility] and [facility], as well as of the fact that they are under no obligation to receive care at these facilities.  PASRR submitted to EDS on 02/12/15     PASRR number received on 02/12/15     Existing PASRR number confirmed on       FL2 transmitted to all facilities in geographic area requested by pt/family on 02/12/15     FL2 transmitted to all facilities within larger geographic area on       Patient informed that his/her managed care company has contracts with or will negotiate with certain facilities, including the following:        Yes   Patient/family informed of bed offers received.  [name]     Physician recommends and patient chooses bed at      [name].  Patient to be transferred to facility by [facility]     Patient family notified on 02/15/15 of transfer.  Name of family member notified:  Herny Kritzer     PHYSICIAN Please sign FL2     Additional Comment:     _______________________________________________ Gwynne EdingerBibbs, Blyss Lugar, LCSW 02/15/2015, 10:08 AM

## 2015-02-15 NOTE — Discharge Summary (Signed)
Physician Discharge Summary  Patient ID: Maureen Ortiz MRN: [id number] DOB/AGE: [birth year] 64 y.o.  Admit date: 02/07/2015 Discharge date: 02/15/2015  Admission Diagnoses: shunt malfunction with hydrocephalus    Discharge Diagnoses: same   Discharged Condition: stable  Hospital Course: The patient was admitted on 02/07/2015 with aphasia and frequent falls. Head CT showed a broken shunt with hydrocephalus.  She was taken to the operating room where the patient underwent VPS placement. The patient tolerated the procedure well and was taken to the recovery room and then to the ICU in stable condition. The hospital course was routine. She had some L facial weakness and increased aphasia. This improved over time.The wound remained clean dry and intact. The patient remained afebrile with stable vital signs, and tolerated a regular diet. The patient continued to increase activities, and pain was well controlled with oral pain medications.   Consults: None  Significant Diagnostic Studies:  Results for orders placed or performed during the hospital encounter of 02/07/15  Urine culture  Result Value Ref Range   Specimen Description URINE, RANDOM    Special Requests NONE    Colony Count      75,000 COLONIES/ML Performed at [facility]    Culture      Multiple bacterial morphotypes present, none predominant. Suggest appropriate recollection if clinically indicated. Performed at [facility]    Report Status 02/08/2015 FINAL   Urinalysis, Routine w reflex microscopic  Result Value Ref Range   Color, Urine YELLOW YELLOW   APPearance HAZY (A) CLEAR   Specific Gravity, Urine 1.025 1.005 - 1.030   pH 6.0 5.0 - 8.0   Glucose, UA NEGATIVE NEGATIVE mg/dL   Hgb urine dipstick NEGATIVE NEGATIVE   Bilirubin Urine SMALL (A) NEGATIVE   Ketones, ur 40 (A) NEGATIVE mg/dL   Protein, ur NEGATIVE NEGATIVE mg/dL   Urobilinogen, UA 1.0 0.0 - 1.0 mg/dL   Nitrite NEGATIVE NEGATIVE    Leukocytes, UA MODERATE (A) NEGATIVE  CBC with Differential/Platelet  Result Value Ref Range   WBC 10.2 4.0 - 10.5 K/uL   RBC 5.00 3.87 - 5.11 MIL/uL   Hemoglobin 12.1 12.0 - 15.0 g/dL   HCT 38.4 36.0 - 46.0 %   MCV 76.8 (L) 78.0 - 100.0 fL   MCH 24.2 (L) 26.0 - 34.0 pg   MCHC 31.5 30.0 - 36.0 g/dL   RDW 17.1 (H) 11.5 - 15.5 %   Platelets 345 150 - 400 K/uL   Neutrophils Relative % 89 (H) 43 - 77 %   Neutro Abs 9.1 (H) 1.7 - 7.7 K/uL   Lymphocytes Relative 7 (L) 12 - 46 %   Lymphs Abs 0.7 0.7 - 4.0 K/uL   Monocytes Relative 4 3 - 12 %   Monocytes Absolute 0.4 0.1 - 1.0 K/uL   Eosinophils Relative 0 0 - 5 %   Eosinophils Absolute 0.0 0.0 - 0.7 K/uL   Basophils Relative 0 0 - 1 %   Basophils Absolute 0.0 0.0 - 0.1 K/uL  Comprehensive metabolic panel  Result Value Ref Range   Sodium 138 135 - 145 mmol/L   Potassium 3.7 3.5 - 5.1 mmol/L   Chloride 100 (L) 101 - 111 mmol/L   CO2 26 22 - 32 mmol/L   Glucose, Bld 129 (H) 65 - 99 mg/dL   BUN 13 6 - 20 mg/dL   Creatinine, Ser 0.76 0.44 - 1.00 mg/dL   Calcium 9.6 8.9 - 10.3 mg/dL   Total Protein 6.5 6.5 -  8.1 g/dL   Albumin 3.8 3.5 - 5.0 g/dL   AST 16 15 - 41 U/L   ALT 13 (L) 14 - 54 U/L   Alkaline Phosphatase 72 38 - 126 U/L   Total Bilirubin 0.8 0.3 - 1.2 mg/dL   GFR calc non Af Amer >60 >60 mL/min   GFR calc Af Amer >60 >60 mL/min   Anion gap 12 5 - 15  Urine microscopic-add on  Result Value Ref Range   Squamous Epithelial / LPF FEW (A) RARE   WBC, UA 11-20 <3 WBC/hpf   RBC / HPF 0-2 <3 RBC/hpf   Bacteria, UA FEW (A) RARE   Casts GRANULAR CAST (A) NEGATIVE   Urine-Other MUCOUS PRESENT   Protime-INR  Result Value Ref Range   Prothrombin Time 14.5 11.6 - 15.2 seconds   INR 1.11 0.00 - 1.49  Glucose, capillary  Result Value Ref Range   Glucose-Capillary 115 (H) 65 - 99 mg/dL  Glucose, capillary  Result Value Ref Range   Glucose-Capillary 129 (H) 65 - 99 mg/dL  Glucose, capillary  Result Value Ref Range    Glucose-Capillary 95 65 - 99 mg/dL  Glucose, capillary  Result Value Ref Range   Glucose-Capillary 106 (H) 65 - 99 mg/dL  Glucose, capillary  Result Value Ref Range   Glucose-Capillary 103 (H) 65 - 99 mg/dL  Glucose, capillary  Result Value Ref Range   Glucose-Capillary 105 (H) 65 - 99 mg/dL  Glucose, capillary  Result Value Ref Range   Glucose-Capillary 97 65 - 99 mg/dL  Glucose, capillary  Result Value Ref Range   Glucose-Capillary 95 65 - 99 mg/dL  Glucose, capillary  Result Value Ref Range   Glucose-Capillary 97 65 - 99 mg/dL  Glucose, capillary  Result Value Ref Range   Glucose-Capillary 88 65 - 99 mg/dL  Glucose, capillary  Result Value Ref Range   Glucose-Capillary 104 (H) 65 - 99 mg/dL  Glucose, capillary  Result Value Ref Range   Glucose-Capillary 88 65 - 99 mg/dL  Glucose, capillary  Result Value Ref Range   Glucose-Capillary 88 65 - 99 mg/dL  Glucose, capillary  Result Value Ref Range   Glucose-Capillary 83 65 - 99 mg/dL  CBG monitoring, ED  Result Value Ref Range   Glucose-Capillary 131 (H) 65 - 99 mg/dL    Ct Head Wo Contrast  02/10/2015   CLINICAL DATA:  Patient found on floor several days ago with increasing confusion. Patient has not returned to baseline, currently does not speak, does not appear to see, and does not follow commands.  EXAM: CT HEAD WITHOUT CONTRAST  TECHNIQUE: Contiguous axial images were obtained from the base of the skull through the vertex without intravenous contrast.  COMPARISON:  Multiple priors, most recent 02/07/2015.  FINDINGS: Since the prior study, the patient has undergone placement of a RIGHT ventriculoperitoneal shunt via a RIGHT posterior approach. Shunt catheter tip in good position. Slight pneumocephalus, non worrisome. Biventricular diameter as measured on today's image 16 appears diminished from 05/25, now 68 mm as compared to 74 mm previous. No dramatic difference in the enlarged fourth ventricle; it is unclear if this  structure communicates with the lateral ventricles.  IMPRESSION: Improved appearance following RIGHT ventriculoperitoneal shunting. Decrease in biventricular diameter.  No dramatic difference in the enlarged fourth ventricle; it is unclear if this communicates with lateral ventricles.   Electronically Signed   By: Rolla Flatten M.D.   On: 02/10/2015 11:27   Ct Head Wo Contrast  02/07/2015  CLINICAL DATA:  Patient found on floor with increased confusion and hematoma right forehead.  EXAM: CT HEAD WITHOUT CONTRAST  CT CERVICAL SPINE WITHOUT CONTRAST  TECHNIQUE: Multidetector CT imaging of the head and cervical spine was performed following the standard protocol without intravenous contrast. Multiplanar CT image reconstructions of the cervical spine were also generated.  COMPARISON:  08/08/2013 and 03/09/2013  FINDINGS: CT HEAD FINDINGS  Previous suboccipital craniectomy with postsurgical changes stable. Prior left frontoparietal craniotomy unchanged. 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 Left occipital ventriculostomy catheter unchanged. The intracranial segment of the left frontal ventriculostomy catheter is no longer seen, however there is a segment of ventriculostomy catheter lying within the mid to posterior body of the right lateral ventricle which may represent the fractured segment of the previously identified left ventriculostomy catheter.  There has been interval enlargement in the ventricles. No change in moderate white matter low-attenuation over the left hemisphere. No change in a large elliptical calcified extra-axial structure over the right frontal region. Postsurgical defect over the right frontoparietal skull unchanged. No evidence of acute hemorrhage or acute infarction. No significant midline shift.  CT CERVICAL SPINE FINDINGS  There straightening of the normal cervical lordosis. There is mild spondylosis throughout the cervical spine. There is disc space narrowing from the  C5-6 level to the C7-T1 level unchanged. Prevertebral soft tissues are within normal. Atlantoaxial articulation is normal. No acute fracture or subluxation. Remainder the exam is unchanged.  IMPRESSION: Since the prior study from 08/08/2013, there has been interval enlargement of the ventricles. There are also findings suggesting fracture of the intracranial segment of the previously seen left frontal ventriculostomy catheter as this segment of catheter now lies within the mid to posterior body of the right lateral ventricle.  Other chronic and postsurgical findings unchanged.  No acute cervical spine injury.  Mild spondylosis of the cervical spine with disc disease from the C5-6 level to the C7-T1 level.  These results were called by telephone at the time of interpretation on 02/07/2015 at 1:18 pm to Dr. Lacretia Leigh , who verbally acknowledged these results.   Electronically Signed   By: Marin Olp M.D.   On: 02/07/2015 13:19   Ct Cervical Spine Wo Contrast  02/07/2015   CLINICAL DATA:  Patient found on floor with increased confusion and hematoma right forehead.  EXAM: CT HEAD WITHOUT CONTRAST  CT CERVICAL SPINE WITHOUT CONTRAST  TECHNIQUE: Multidetector CT imaging of the head and cervical spine was performed following the standard protocol without intravenous contrast. Multiplanar CT image reconstructions of the cervical spine were also generated.  COMPARISON:  08/08/2013 and 03/09/2013  FINDINGS: CT HEAD FINDINGS  Previous suboccipital craniectomy with postsurgical changes stable. Prior left frontoparietal craniotomy unchanged. 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 Left occipital ventriculostomy catheter unchanged. The intracranial segment of the left frontal ventriculostomy catheter is no longer seen, however there is a segment of ventriculostomy catheter lying within the mid to posterior body of the right lateral ventricle which may represent the fractured segment of the previously  identified left ventriculostomy catheter.  There has been interval enlargement in the ventricles. No change in moderate white matter low-attenuation over the left hemisphere. No change in a large elliptical calcified extra-axial structure over the right frontal region. Postsurgical defect over the right frontoparietal skull unchanged. No evidence of acute hemorrhage or acute infarction. No significant midline shift.  CT CERVICAL SPINE FINDINGS  There straightening of the normal cervical lordosis. There is mild spondylosis throughout the cervical  spine. There is disc space narrowing from the C5-6 level to the C7-T1 level unchanged. Prevertebral soft tissues are within normal. Atlantoaxial articulation is normal. No acute fracture or subluxation. Remainder the exam is unchanged.  IMPRESSION: Since the prior study from 08/08/2013, there has been interval enlargement of the ventricles. There are also findings suggesting fracture of the intracranial segment of the previously seen left frontal ventriculostomy catheter as this segment of catheter now lies within the mid to posterior body of the right lateral ventricle.  Other chronic and postsurgical findings unchanged.  No acute cervical spine injury.  Mild spondylosis of the cervical spine with disc disease from the C5-6 level to the C7-T1 level.  These results were called by telephone at the time of interpretation on 02/07/2015 at 1:18 pm to Dr. Lacretia Leigh , who verbally acknowledged these results.   Electronically Signed   By: Marin Olp M.D.   On: 02/07/2015 13:19   Ct Abdomen Pelvis W Contrast  02/04/2015   CLINICAL DATA:  Sudden onset of severe abdominal pain after dinner this evening. Right lower quadrant pain.  EXAM: CT ABDOMEN AND PELVIS WITH CONTRAST  TECHNIQUE: Multidetector CT imaging of the abdomen and pelvis was performed using the standard protocol following bolus administration of intravenous contrast.  CONTRAST:  18m OMNIPAQUE IOHEXOL 300 MG/ML  SOLN, 1053mOMNIPAQUE IOHEXOL 300 MG/ML SOLN  COMPARISON:  None.  FINDINGS: Large hiatal/ paraesophageal hernia, uppermost aspect not included in the field of view. The lung bases are otherwise clear.  There are 2 small heterogeneously hypodense lesions in the liver, 9 mm inferiorly in the right hepatic lobe, and 11 mm in segment 6/7. These have the appearance of hemangiomas. The gallbladder is physiologically distended. The spleen and adrenal glands are normal. There is pancreatic atrophy without ductal dilatation or surrounding inflammatory change. Kidneys demonstrate symmetric enhancement and excretion without hydronephrosis or localizing abnormality.  Contrast distending the stomach, large hiatal hernia/paraesophageal hernia. No obstruction. There are no dilated or thickened small bowel loops. The appendix is seen arising from the cecum and contains small amount of high-density material, likely contrast. There is no periappendiceal inflammatory change. The cecum is fluid-filled. Normal fatty terminal ileum is seen, axial image 56/91. In the proximal ascending colon there is a 1.4 cm fat density lesion that may reflect a mural lipoma, axial image 49/91, fluid is seen in the cecum. No proximal bowel dilatation. Small volume of stool in the proximal colon, moderate volume of stool in the more distal colon. No colonic wall thickening. No free air, free fluid, or intra-abdominal fluid collection.  No retroperitoneal adenopathy. Abdominal aorta is normal in caliber. Moderate atherosclerosis of the abdominal aorta and branches. Small bore peritoneal catheter coursing in the right anterior abdominal wall, tip in the right lower quadrant without adjacent fluid collection. There is a larger peritoneal catheter, coursing on the left anterior abdominal wall, tip in the left upper quadrant. No adjacent fluid collection.  Within the pelvis the bladder is physiologically distended. The uterus remains in situ. The ovaries are  not discretely identified, likely atrophic and normal for age. There is no free fluid in the pelvis. No pelvic adenopathy.  Compression deformity of L4 with large superior and inferior endplate Schmorl's nodes. Moderate compression deformity of L1 primarily involving superior endplate. There is moderate degenerative change throughout the lumbar spine.  IMPRESSION: 1. Contrast distending the stomach with large hiatal/paraesophageal hernia. No evidence of volvulus or obstruction. 2. Fat density lesion probable wall lipoma in the  ascending colon measuring 1.4 cm. No proximal obstruction. This could be further assessed with colonoscopy. The appendix is normal. 3. Hepatic hemangiomas. 4. Remote compression deformities of L1 and L4.   Electronically Signed   By: Jeb Levering M.D.   On: 02/04/2015 00:32    Antibiotics:  Anti-infectives    Start     Dose/Rate Route Frequency Ordered Stop   02/10/15 0230  ceFAZolin (ANCEF) IVPB 1 g/50 mL premix     1 g 100 mL/hr over 30 Minutes Intravenous Every 8 hours 02/09/15 2117 02/10/15 1020   02/09/15 1855  bacitracin 50,000 Units in sodium chloride irrigation 0.9 % 500 mL irrigation  Status:  Discontinued       As needed 02/09/15 1924 02/09/15 2010      Discharge Exam: Blood pressure 170/86, pulse 82, temperature 97.7 F (36.5 C), temperature source Oral, resp. rate 18, height _0  (1.676 m), weight 141 lb 8.6 oz (64.2 kg), SpO2 99 %. She has her baseline aphasia, but awake and alert and maex$ Incision CDI  Discharge Medications:     Medication List    TAKE these medications        calcium-vitamin D 500-200 MG-UNIT per tablet  Commonly known as:  OSCAL WITH D  Take 1 tablet by mouth daily.     cholecalciferol 1000 UNITS tablet  Commonly known as:  VITAMIN D  Take 1,000 Units by mouth daily.     ferrous sulfate 325 (65 FE) MG EC tablet  Take 325 mg by mouth daily.     FLUoxetine 40 MG capsule  Commonly known as:  PROZAC  TAKE 1 BY MOUTH  EVERY MORNING     loratadine 10 MG tablet  Commonly known as:  CLARITIN  Take 10 mg by mouth daily.     losartan 50 MG tablet  Commonly known as:  COZAAR  Take 50 mg by mouth daily.     multivitamins ther. w/minerals Tabs tablet  Take 1 tablet by mouth daily.     peg 3350 powder 100 G Solr  Commonly known as:  MOVIPREP  Take 1 kit (200 g total) by mouth once.        Disposition: SNF   Final Dx: VPS placement      Discharge Instructions    Call MD for:  difficulty breathing, headache or visual disturbances    Complete by:  As directed      Call MD for:  persistant nausea and vomiting    Complete by:  As directed      Call MD for:  redness, tenderness, or signs of infection (pain, swelling, redness, odor or green/yellow discharge around incision site)    Complete by:  As directed      Call MD for:  severe uncontrolled pain    Complete by:  As directed      Call MD for:  temperature >100.4    Complete by:  As directed      Diet - low sodium heart healthy    Complete by:  As directed      Discharge instructions    Complete by:  As directed   No strenuous activity     Discharge wound care:    Complete by:  As directed   Remove staples from scalp in 5 days     Increase activity slowly    Complete by:  As directed            Follow-up Information  Follow up with Timara Loma S, MD. Schedule an appointment as soon as possible for a visit in 2 weeks.   Specialty:  Neurosurgery   Contact information:   [address] [place] [address] [phone]        Signed: Eustace Moore 02/15/2015, 9:33 AM

## 2015-02-15 NOTE — Progress Notes (Signed)
Discharge orders received. Pt notified. IV and tele removed. Pt dressed and belongings packed. [facility]. Awaiting PTAR. Will continue to monitor.

## 2015-02-15 NOTE — Evaluation (Addendum)
Occupational Therapy Evaluation Patient Details Name: Maureen Ortiz MRN: [id number] DOB: [birth year] Today's Date: 02/15/2015    History of Present Illness 64 yo WF with very complex PMH with multiple VP shunt placements who presents with a several day h/o changes in function. She has dysphasia at baseline and frequent falls. Maybe has been sleeping a little more, but denies headache or nausea and vomiting. Much of the communication is through her daughter who works at our hospital. She has seen Dr. Newell Coral and Dr. Angelyn Punt in the past. Her last head CT here was in 2014 which she had a head CT in May 2015 at [facility]. Head CT here showed fracture of the left frontal ventricular catheter and increasing ventricular size when compared to her previous head CT. Neurosurgical evaluation was requested. S/P VP shunting.   Clinical Impression   Patient is s/p VP shunt placement surgery resulting in functional limitations due to the deficits listed below (see OT problem list).  Patient will benefit from skilled OT acutely to increase independence and safety with ADLS to allow discharge SNF. OT to defer all further care to SNF level . Family present on eval and very supportive. Family unable able manage patient at current level. Pt requires 24/7 (A) for care for all adls.      Follow Up Recommendations  SNF    Equipment Recommendations       Recommendations for Other Services       Precautions / Restrictions Precautions Precautions: Fall      Mobility Bed Mobility Overal bed mobility: Needs Assistance Bed Mobility: Supine to Sit;Sit to Supine     Supine to sit: Min guard;HOB elevated Sit to supine: Min guard;HOB elevated   General bed mobility comments: cues for hand placement incr time HOB elevated and use of bed rail heavily. Pt would require (A) for standard bed surface  Transfers Overall transfer level: Needs assistance Equipment used: None Transfers: Sit to/from  Stand Sit to Stand: Min assist;From elevated surface         General transfer comment: bil knees blocked for static standing. pt requires physical (A)    Balance Overall balance assessment: Needs assistance Sitting-balance support: Bilateral upper extremity supported;Feet supported Sitting balance-Leahy Scale: Poor     Standing balance support: Bilateral upper extremity supported;During functional activity Standing balance-Leahy Scale: Poor Standing balance comment: pt releasing therapist and immediate LOB                            ADL Overall ADL's : Needs assistance/impaired Eating/Feeding: Minimal assistance;Bed level Eating/Feeding Details (indicate cue type and reason): undershooting with hand to mouth.  Grooming: Wash/dry face;Minimal assistance;Bed level Grooming Details (indicate cue type and reason): undershooting with hand to face Upper Body Bathing: Moderate assistance;Bed level Upper Body Bathing Details (indicate cue type and reason): pt demonstrates decr balance so must due it supine level due to balance deficit Lower Body Bathing: Moderate assistance;Bed level Lower Body Bathing Details (indicate cue type and reason): incontinent of bladder unaware required (A) for hygiene during session Upper Body Dressing : Minimal assistance;Bed level Upper Body Dressing Details (indicate cue type and reason): due to decr depth perception required (A) Lower Body Dressing: Maximal assistance;Sit to/from stand Lower Body Dressing Details (indicate cue type and reason): due to balance deficits and inability to reach feet Toilet Transfer: Minimal assistance (incontinence ) Toilet Transfer Details (indicate cue type and reason): pt can sit <>Stand min (A) however  required MOD (A) due to poor control to stand pivot Toileting- Clothing Manipulation and Hygiene: Total assistance;Sit to/from Nurse, children'sstand     Tub/Shower Transfer Details (indicate cue type and reason): not advised at  this time due to balance deficits - sponge bath only Functional mobility during ADLs:  (see PT eval- pt completed basic transfer with (A)) General ADL Comments: Daughter reports bed mobility close to baseline     Vision Vision Assessment?: Yes Eye Alignment: Within Functional Limits Ocular Range of Motion: Within Functional Limits Tracking/Visual Pursuits: Requires cues, head turns, or add eye shifts to track Convergence: Impaired (comment) Depth Perception: Undershoots Additional Comments: Daughter reports "it might be slightly worse than normal". Pt demonstrates deficits due to undershooting    Perception     Praxis      Pertinent Vitals/Pain Pain Assessment: No/denies pain     Hand Dominance Right   Extremity/Trunk Assessment Upper Extremity Assessment Upper Extremity Assessment: RUE deficits/detail;LUE deficits/detail RUE Deficits / Details: undershoots with Finger to nose LUE Deficits / Details: L UE drift with supination shoulder flexion, grasp WFL, undershoots   Lower Extremity Assessment Lower Extremity Assessment: Defer to PT evaluation   Cervical / Trunk Assessment Cervical / Trunk Assessment: Kyphotic   Communication Communication Communication: Receptive difficulties;Expressive difficulties;HOH   Cognition Arousal/Alertness: Awake/alert Behavior During Therapy: WFL for tasks assessed/performed Overall Cognitive Status: History of cognitive impairments - at baseline                     General Comments       Exercises       Shoulder Instructions      Home Living Family/patient expects to be discharged to:: Skilled nursing facility                                        Prior Functioning/Environment Level of Independence: Needs assistance    ADL's / Homemaking Assistance Needed: transfers and bathing/ dressing        OT Diagnosis: Generalized weakness   OT Problem List:     OT Treatment/Interventions:      OT  Goals(Current goals can be found in the care plan section) Acute Rehab OT Goals Patient Stated Goal: to go to rehab OT Goal Formulation: With patient/family Potential to Achieve Goals: Good  OT Frequency:     Barriers to D/C:            Co-evaluation              End of Session Equipment Utilized During Treatment: Gait belt Nurse Communication: Mobility status;Precautions  Activity Tolerance: Patient tolerated treatment well Patient left: in bed;with call bell/phone within reach;with bed alarm set;with family/visitor present   Time: [id number] OT Time Calculation (min): 12 min Charges:  OT General Charges $OT Visit: 1 Procedure OT Evaluation $Initial OT Evaluation Tier I: 1 Procedure [id number] Master:    Johnathan Heskett B 02/15/2015, 12:04 PM Pager: [phone]

## 2015-02-15 NOTE — Clinical Social Work Note (Signed)
CSW spoke with the pt's father Sherilyn CooterHenry. CSW informed Sherilyn CooterHenry that [facility] does not have females beds today. CSW provided [place] with 11 bed offers. Sherilyn CooterHenry reported that he need to talk to his daughter Marylene Landngela. CSW awaiting a decision from the family.  Cordae Mccarey, MSW, LCSWA [phone]

## 2015-02-16 ENCOUNTER — Non-Acute Institutional Stay (SKILLED_NURSING_FACILITY): Payer: Medicare Other | Admitting: Adult Health

## 2015-02-16 ENCOUNTER — Encounter: Payer: Self-pay | Admitting: Adult Health

## 2015-02-16 DIAGNOSIS — J309 Allergic rhinitis, unspecified: Secondary | ICD-10-CM

## 2015-02-16 DIAGNOSIS — Z982 Presence of cerebrospinal fluid drainage device: Secondary | ICD-10-CM | POA: Diagnosis not present

## 2015-02-16 DIAGNOSIS — R5381 Other malaise: Secondary | ICD-10-CM

## 2015-02-16 DIAGNOSIS — I10 Essential (primary) hypertension: Secondary | ICD-10-CM

## 2015-02-16 DIAGNOSIS — F32A Depression, unspecified: Secondary | ICD-10-CM

## 2015-02-16 DIAGNOSIS — F329 Major depressive disorder, single episode, unspecified: Secondary | ICD-10-CM | POA: Diagnosis not present

## 2015-02-16 DIAGNOSIS — T859XXS Unspecified complication of internal prosthetic device, implant and graft, sequela: Secondary | ICD-10-CM

## 2015-02-16 DIAGNOSIS — T85618S Breakdown (mechanical) of other specified internal prosthetic devices, implants and grafts, sequela: Secondary | ICD-10-CM

## 2015-02-16 NOTE — Progress Notes (Signed)
Patient ID: Maureen Ortiz, female   DOB: 09/17/1950, 64 y.o.   MRN: [id number]   02/16/2015  Facility:  Nursing Home Location:  [facility] Room Number: 1201-P LEVEL OF CARE:  SNF (31)   Chief Complaint  Patient presents with  . Hospitalization Follow-up    Physical deconditioning, shunt malfunction S/P ventricular shunt placement, hypertension, anemia, depression and allergic rhinitis    HISTORY OF PRESENT ILLNESS:   This is a 64 year old female who was been admitted to [facility] on 02/15/15 from [facility]. She has PMH of DVT, hard of hearing, congenital hydrocephalus, seizure, major depression and vertigo. She was having aphasia and frequent falls. Head CT showed a broken shunt with hydrocephalus. She had VPS placement on 5/27.  He has been admitted for a short-term rehabilitation.  PAST MEDICAL HISTORY:  Past Medical History  Diagnosis Date  . Falls frequently   . Hydrocephalus   . Hard of hearing   . Depression   . Incontinent of urine   . DVT of lower extremity (deep venous thrombosis)   . Hypertension   . Seizures   . Vertigo     CURRENT MEDICATIONS: Reviewed per MAR/see medication list  Allergies  Allergen Reactions  . Codeine Nausea And Vomiting     REVIEW OF SYSTEMS:   GENERAL: no change in appetite, no fatigue, no weight changes, no fever, chills or weakness RESPIRATORY: no cough, SOB, DOE, wheezing, hemoptysis CARDIAC: no chest pain, edema or palpitations GI: no abdominal pain, diarrhea, constipation, heart burn, nausea or vomiting  PHYSICAL EXAMINATION  GENERAL: no acute distress, normal body habitus SKIN:  Staples to right scalp, palpable shunt on bilateral sides of head EYES: conjunctivae normal, sclerae normal, normal eye lids NECK: supple, trachea midline, no neck masses, no thyroid tenderness, no thyromegaly LYMPHATICS: no LAN in the neck, no supraclavicular LAN RESPIRATORY: breathing is even &  unlabored, BS CTAB CARDIAC: RRR, no murmur,no extra heart sounds, no edema GI: abdomen soft, normal BS, no masses, no tenderness, no hepatomegaly, no splenomegaly EXTREMITIES:  Able to move X 4 extremities; limited ROM to bilateral shoulders PSYCHIATRIC: the patient is alert & oriented to person, affect & behavior appropriate  LABS/RADIOLOGY: Labs reviewed: Basic Metabolic Panel:  Recent Labs  40/98/11 2221 02/07/15 1140  NA 139 138  K 4.2 3.7  CL 102 100*  CO2 20* 26  GLUCOSE 229* 129*  BUN 22* 13  CREATININE 1.07* 0.76  CALCIUM 9.6 9.6   Liver Function Tests:  Recent Labs  02/03/15 2221 02/07/15 1140  AST 25 16  ALT 12* 13*  ALKPHOS 77 72  BILITOT 0.9 0.8  PROT 7.6 6.5  ALBUMIN 4.4 3.8    Recent Labs  02/03/15 2221  LIPASE 20*   CBC:  Recent Labs  02/03/15 2221 02/07/15 1140  WBC 10.4 10.2  NEUTROABS 7.7 9.1*  HGB 11.6* 12.1  HCT 37.9 38.4  MCV 77.3* 76.8*  PLT 356 345   Cardiac Enzymes:  Recent Labs  02/03/15 2221  TROPONINI <0.03   CBG:  Recent Labs  02/11/15 1939 02/11/15 2327 02/12/15 0323  GLUCAP 88 88 83    Ct Head Wo Contrast  02/10/2015   CLINICAL DATA:  Patient found on floor several days ago with increasing confusion. Patient has not returned to baseline, currently does not speak, does not appear to see, and does not follow commands.  EXAM: CT HEAD WITHOUT CONTRAST  TECHNIQUE: Contiguous axial images were obtained from  the base of the skull through the vertex without intravenous contrast.  COMPARISON:  Multiple priors, most recent 02/07/2015.  FINDINGS: Since the prior study, the patient has undergone placement of a RIGHT ventriculoperitoneal shunt via a RIGHT posterior approach. Shunt catheter tip in good position. Slight pneumocephalus, non worrisome. Biventricular diameter as measured on today's image 16 appears diminished from 05/25, now 68 mm as compared to 74 mm previous. No dramatic difference in the enlarged fourth  ventricle; it is unclear if this structure communicates with the lateral ventricles.  IMPRESSION: Improved appearance following RIGHT ventriculoperitoneal shunting. Decrease in biventricular diameter.  No dramatic difference in the enlarged fourth ventricle; it is unclear if this communicates with lateral ventricles.   Electronically Signed   By: Davonna Belling M.D.   On: 02/10/2015 11:27   Ct Head Wo Contrast  02/07/2015   CLINICAL DATA:  Patient found on floor with increased confusion and hematoma right forehead.  EXAM: CT HEAD WITHOUT CONTRAST  CT CERVICAL SPINE WITHOUT CONTRAST  TECHNIQUE: Multidetector CT imaging of the head and cervical spine was performed following the standard protocol without intravenous contrast. Multiplanar CT image reconstructions of the cervical spine were also generated.  COMPARISON:  08/08/2013 and 03/09/2013  FINDINGS: CT HEAD FINDINGS  Previous suboccipital craniectomy with postsurgical changes stable. Prior left frontoparietal craniotomy unchanged. 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 Left occipital ventriculostomy catheter unchanged. The intracranial segment of the left frontal ventriculostomy catheter is no longer seen, however there is a segment of ventriculostomy catheter lying within the mid to posterior body of the right lateral ventricle which may represent the fractured segment of the previously identified left ventriculostomy catheter.  There has been interval enlargement in the ventricles. No change in moderate white matter low-attenuation over the left hemisphere. No change in a large elliptical calcified extra-axial structure over the right frontal region. Postsurgical defect over the right frontoparietal skull unchanged. No evidence of acute hemorrhage or acute infarction. No significant midline shift.  CT CERVICAL SPINE FINDINGS  There straightening of the normal cervical lordosis. There is mild spondylosis throughout the cervical spine. There is  disc space narrowing from the C5-6 level to the C7-T1 level unchanged. Prevertebral soft tissues are within normal. Atlantoaxial articulation is normal. No acute fracture or subluxation. Remainder the exam is unchanged.  IMPRESSION: Since the prior study from 08/08/2013, there has been interval enlargement of the ventricles. There are also findings suggesting fracture of the intracranial segment of the previously seen left frontal ventriculostomy catheter as this segment of catheter now lies within the mid to posterior body of the right lateral ventricle.  Other chronic and postsurgical findings unchanged.  No acute cervical spine injury.  Mild spondylosis of the cervical spine with disc disease from the C5-6 level to the C7-T1 level.  These results were called by telephone at the time of interpretation on 02/07/2015 at 1:18 pm to Dr. Lorre Nick , who verbally acknowledged these results.   Electronically Signed   By: Elberta Fortis M.D.   On: 02/07/2015 13:19   Ct Cervical Spine Wo Contrast  02/07/2015   CLINICAL DATA:  Patient found on floor with increased confusion and hematoma right forehead.  EXAM: CT HEAD WITHOUT CONTRAST  CT CERVICAL SPINE WITHOUT CONTRAST  TECHNIQUE: Multidetector CT imaging of the head and cervical spine was performed following the standard protocol without intravenous contrast. Multiplanar CT image reconstructions of the cervical spine were also generated.  COMPARISON:  08/08/2013 and 03/09/2013  FINDINGS: CT HEAD  FINDINGS  Previous suboccipital craniectomy with postsurgical changes stable. Prior left frontoparietal craniotomy unchanged. 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 Left occipital ventriculostomy catheter unchanged. The intracranial segment of the left frontal ventriculostomy catheter is no longer seen, however there is a segment of ventriculostomy catheter lying within the mid to posterior body of the right lateral ventricle which may represent the fractured  segment of the previously identified left ventriculostomy catheter.  There has been interval enlargement in the ventricles. No change in moderate white matter low-attenuation over the left hemisphere. No change in a large elliptical calcified extra-axial structure over the right frontal region. Postsurgical defect over the right frontoparietal skull unchanged. No evidence of acute hemorrhage or acute infarction. No significant midline shift.  CT CERVICAL SPINE FINDINGS  There straightening of the normal cervical lordosis. There is mild spondylosis throughout the cervical spine. There is disc space narrowing from the C5-6 level to the C7-T1 level unchanged. Prevertebral soft tissues are within normal. Atlantoaxial articulation is normal. No acute fracture or subluxation. Remainder the exam is unchanged.  IMPRESSION: Since the prior study from 08/08/2013, there has been interval enlargement of the ventricles. There are also findings suggesting fracture of the intracranial segment of the previously seen left frontal ventriculostomy catheter as this segment of catheter now lies within the mid to posterior body of the right lateral ventricle.  Other chronic and postsurgical findings unchanged.  No acute cervical spine injury.  Mild spondylosis of the cervical spine with disc disease from the C5-6 level to the C7-T1 level.  These results were called by telephone at the time of interpretation on 02/07/2015 at 1:18 pm to Dr. Lorre NickANTHONY ALLEN , who verbally acknowledged these results.   Electronically Signed   By: Elberta Fortisaniel  Boyle M.D.   On: 02/07/2015 13:19   Ct Abdomen Pelvis W Contrast  02/04/2015   CLINICAL DATA:  Sudden onset of severe abdominal pain after dinner this evening. Right lower quadrant pain.  EXAM: CT ABDOMEN AND PELVIS WITH CONTRAST  TECHNIQUE: Multidetector CT imaging of the abdomen and pelvis was performed using the standard protocol following bolus administration of intravenous contrast.  CONTRAST:  50mL  OMNIPAQUE IOHEXOL 300 MG/ML SOLN, 100mL OMNIPAQUE IOHEXOL 300 MG/ML SOLN  COMPARISON:  None.  FINDINGS: Large hiatal/ paraesophageal hernia, uppermost aspect not included in the field of view. The lung bases are otherwise clear.  There are 2 small heterogeneously hypodense lesions in the liver, 9 mm inferiorly in the right hepatic lobe, and 11 mm in segment 6/7. These have the appearance of hemangiomas. The gallbladder is physiologically distended. The spleen and adrenal glands are normal. There is pancreatic atrophy without ductal dilatation or surrounding inflammatory change. Kidneys demonstrate symmetric enhancement and excretion without hydronephrosis or localizing abnormality.  Contrast distending the stomach, large hiatal hernia/paraesophageal hernia. No obstruction. There are no dilated or thickened small bowel loops. The appendix is seen arising from the cecum and contains small amount of high-density material, likely contrast. There is no periappendiceal inflammatory change. The cecum is fluid-filled. Normal fatty terminal ileum is seen, axial image 56/91. In the proximal ascending colon there is a 1.4 cm fat density lesion that may reflect a mural lipoma, axial image 49/91, fluid is seen in the cecum. No proximal bowel dilatation. Small volume of stool in the proximal colon, moderate volume of stool in the more distal colon. No colonic wall thickening. No free air, free fluid, or intra-abdominal fluid collection.  No retroperitoneal adenopathy. Abdominal aorta is normal in caliber.  Moderate atherosclerosis of the abdominal aorta and branches. Small bore peritoneal catheter coursing in the right anterior abdominal wall, tip in the right lower quadrant without adjacent fluid collection. There is a larger peritoneal catheter, coursing on the left anterior abdominal wall, tip in the left upper quadrant. No adjacent fluid collection.  Within the pelvis the bladder is physiologically distended. The uterus  remains in situ. The ovaries are not discretely identified, likely atrophic and normal for age. There is no free fluid in the pelvis. No pelvic adenopathy.  Compression deformity of L4 with large superior and inferior endplate Schmorl's nodes. Moderate compression deformity of L1 primarily involving superior endplate. There is moderate degenerative change throughout the lumbar spine.  IMPRESSION: 1. Contrast distending the stomach with large hiatal/paraesophageal hernia. No evidence of volvulus or obstruction. 2. Fat density lesion probable wall lipoma in the ascending colon measuring 1.4 cm. No proximal obstruction. This could be further assessed with colonoscopy. The appendix is normal. 3. Hepatic hemangiomas. 4. Remote compression deformities of L1 and L4.   Electronically Signed   By: Rubye Oaks M.D.   On: 02/04/2015 00:32    ASSESSMENT/PLAN:  Physical deconditioning - for rehabilitation Shunt malfunction S/P ventricular shunt placement - follow-up with Dr. Marikay Alar, neurosurgery in 2 weeks; remove staples on scalp in 5 days Hypertension -  BP 166/54, 166/94, 158/78 1nd 182/93 , elevated; continue Cozaar 50 mg 1 tab by mouth daily and add Cozaar 25 mg PO Q PM; BP Q shift X 1 week Anemia, acute blood loss -  hgb 12.1; discontinue FeSO4 Depression - mood is stable; continue Prozac 40 mg PO Q AM Allergic rhinitis - continue Claritin 10 mg 1 tab PO Q D   Goals of care:  Short-term rehabilitation   Labs/test ordered:  CBC and BMP   Spent 50 minutes in patient care.   [facility] AL[facility]ena, NP [employer] [phone]

## 2015-02-20 ENCOUNTER — Encounter: Payer: Self-pay | Admitting: Internal Medicine

## 2015-02-20 ENCOUNTER — Non-Acute Institutional Stay (SKILLED_NURSING_FACILITY): Payer: Medicare Other | Admitting: Internal Medicine

## 2015-02-20 DIAGNOSIS — D509 Iron deficiency anemia, unspecified: Secondary | ICD-10-CM | POA: Diagnosis not present

## 2015-02-20 DIAGNOSIS — R5381 Other malaise: Secondary | ICD-10-CM

## 2015-02-20 DIAGNOSIS — J309 Allergic rhinitis, unspecified: Secondary | ICD-10-CM

## 2015-02-20 DIAGNOSIS — T85618S Breakdown (mechanical) of other specified internal prosthetic devices, implants and grafts, sequela: Secondary | ICD-10-CM

## 2015-02-20 DIAGNOSIS — T859XXS Unspecified complication of internal prosthetic device, implant and graft, sequela: Secondary | ICD-10-CM

## 2015-02-20 DIAGNOSIS — I10 Essential (primary) hypertension: Secondary | ICD-10-CM

## 2015-02-20 DIAGNOSIS — F329 Major depressive disorder, single episode, unspecified: Secondary | ICD-10-CM | POA: Diagnosis not present

## 2015-02-20 DIAGNOSIS — F32A Depression, unspecified: Secondary | ICD-10-CM

## 2015-02-20 NOTE — Progress Notes (Signed)
Opened in error

## 2015-02-20 NOTE — Progress Notes (Signed)
Patient ID: Maureen Ortiz, female   DOB: November 10, 1950, 64 y.o.   MRN: [id number]     [place]  PCP: Vic Ripper, RN  Code Status: Full Code   Allergies  Allergen Reactions  . Codeine Nausea And Vomiting    Chief Complaint  Patient presents with  . New Admit To SNF    New Admission      HPI:  64 year old patient is here for short term rehabilitation post hospital admission from 02/07/15- 02/15/15 with frequent falls and aphasia. Head CT showed a broken shunt with hydrocephalus. She had VPS placement on 5/27. Her aphasia has improved. She is seen in her room today. She is hard of hearing with cochlear implant but her machine is not with her today ( her family has taken it home). This makes her HPI and ROS limited. I have used white board and marker to write and communicate with her this visit. She has PMH of DVT, hard of hearing, congenital hydrocephalus, seizure, major depression and vertigo. She denies any concerns this visit   Review of Systems:  Constitutional: Negative for fever, chills, diaphoresis. positive for easy fatigue. HENT: Negative for headache, congestion, nasal discharge Eyes: Negative for eye pain, blurred vision, double vision and discharge.  Respiratory: Negative for cough, shortness of breath and wheezing.   Cardiovascular: Negative for chest pain, palpitations, leg swelling.  Gastrointestinal: Negative for heartburn, nausea, vomiting, abdominal pain Genitourinary: Negative for dysuria Musculoskeletal: Negative for back pain, falls in facility Skin: Negative for itching, rash.  Neurological: Negative for dizziness, tingling, focal weakness Psychiatric/Behavioral: Negative for depression   Past Medical History  Diagnosis Date  . Falls frequently   . Hydrocephalus   . Hard of hearing   . Depression   . Incontinent of urine   . DVT of lower extremity (deep venous thrombosis)   . Hypertension   . Seizures   . Vertigo    Past  Surgical History  Procedure Laterality Date  . Csf shunt    . Breast biopsy      benign x 2  . Brain surgery      multiple  . Paraovarian cyst removal    . Tubal ligation    . Knee surgery      x 3  . Cochlear implant      left  . Ventriculoperitoneal shunt Right 02/09/2015    Procedure: SHUNT INSERTION VENTRICULAR-PERITONEAL;  Surgeon: Eustace Moore, MD;  Location: [facility];  Service: Neurosurgery;  Laterality: Right;  . Laparoscopic revision ventricular-peritoneal (v-p) shunt Right 02/09/2015    Procedure: LAPAROSCOPIC REVISION VENTRICULAR-PERITONEAL (V-P) SHUNT;  Surgeon: Eustace Moore, MD;  Location: [facility];  Service: Neurosurgery;  Laterality: Right;   Social History:   reports that she has never smoked. She has never used smokeless tobacco. She reports that she drinks alcohol. She reports that she does not use illicit drugs.  Family History  Problem Relation Age of Onset  . Stroke Mother   . Heart disease Father   . Heart disease Mother   . Colon cancer Neg Hx     Medications: Patient's Medications  New Prescriptions   No medications on file  Previous Medications   CALCIUM-VITAMIN D (OSCAL WITH D) 500-200 MG-UNIT PER TABLET    Take 1 tablet by mouth daily.     CHOLECALCIFEROL (VITAMIN D) 1000 UNITS TABLET    Take 1,000 Units by mouth daily.   FERROUS SULFATE 325 (65  FE) MG EC TABLET    Take 325 mg by mouth daily.     FLUOXETINE (PROZAC) 40 MG CAPSULE    TAKE 1 BY MOUTH EVERY MORNING   LORATADINE (CLARITIN) 10 MG TABLET    Take 10 mg by mouth daily.   LOSARTAN (COZAAR) 25 MG TABLET    Take 25 mg by mouth every evening.   LOSARTAN (COZAAR) 50 MG TABLET    Take 50 mg by mouth daily.   MULTIPLE VITAMINS-MINERALS (MULTIVITAMINS THER. W/MINERALS) TABS    Take 1 tablet by mouth daily.     PEG 3350 POWDER (MOVIPREP) 100 G SOLR    Take 1 kit (200 g total) by mouth once.  Modified Medications   No medications on file  Discontinued Medications   No medications on file      Physical Exam: Filed Vitals:   02/20/15 1407  BP: 165/86  Pulse: 73  Temp: 97.1 F (36.2 C)  TempSrc: Oral  Resp: 18  Height: _0  (1.676 m)  Weight: 132 lb 3.2 oz (59.966 kg)  SpO2: 99%    General- elderly female, thin built, in no acute distress Head- large cranium, right scalp incision healing well, has 9 staples.  Ears- normal external ear canal Throat- moist mucus membrane Eyes- PERRLA, EOMI, no pallor, no icterus, no discharge, normal conjunctiva, normal sclera, has corrective glasses Neck- no cervical lymphadenopathy Cardiovascular- normal s1,s2, no murmurs, palpable dorsalis pedis and radial pulses, no leg edema Respiratory- bilateral clear to auscultation, no wheeze, no rhonchi, no crackles, no use of accessory muscles Abdomen- bowel sounds present, soft, non tender Musculoskeletal- able to move all 4 extremities, generalized weakness Neurological- no focal deficit Skin- warm and dry Psychiatry- alert and oriented to person, place and time, normal mood and affect   Labs reviewed: Basic Metabolic Panel:  Recent Labs  02/03/15 2221 02/07/15 1140  NA 139 138  K 4.2 3.7  CL 102 100*  CO2 20* 26  GLUCOSE 229* 129*  BUN 22* 13  CREATININE 1.07* 0.76  CALCIUM 9.6 9.6   Liver Function Tests:  Recent Labs  02/03/15 2221 02/07/15 1140  AST 25 16  ALT 12* 13*  ALKPHOS 77 72  BILITOT 0.9 0.8  PROT 7.6 6.5  ALBUMIN 4.4 3.8    Recent Labs  02/03/15 2221  LIPASE 20*   No results for input(s): AMMONIA in the last 8760 hours. CBC:  Recent Labs  02/03/15 2221 02/07/15 1140  WBC 10.4 10.2  NEUTROABS 7.7 9.1*  HGB 11.6* 12.1  HCT 37.9 38.4  MCV 77.3* 76.8*  PLT 356 345   Cardiac Enzymes:  Recent Labs  02/03/15 2221  TROPONINI <0.03   BNP: Invalid input(s): POCBNP CBG:  Recent Labs  02/11/15 1939 02/11/15 2327 02/12/15 0323  GLUCAP 88 88 83    Radiological Exams:     Assessment/Plan  Physical deconditioning  Will  have her work with physical therapy and occupational therapy team to help with gait training and muscle strengthening exercises.fall precautions. Skin care. Encourage to be out of bed.   Shunt malfunction  S/P ventricular shunt placement. Has with neurosurgery Dr. Sherley Bounds. Will need staple removal  Microcytic anemia Monitor h&h, stable hemoglobin but has low MCV. Continue ferrous sulfate 325 mg po daily  Hypertension Elevated bp readings. On cozaar 50 mg in am and 25 mg in pm with dose adjsutment made on 02/16/15. Continue to monitor bp and if SBP > 140 in > 3 readings in 1  week, increase cozaar to 50 mg bid.    Depression continue Prozac 40 mg daily  Allergic rhinitis  continue Claritin 10 mg daily   Goals of care: short term rehabilitation    Labs/tests ordered: cbc, bmp  Family/ staff Communication: reviewed care plan with patient and nursing supervisor    Blanchie Serve, MD  [facility] [phone] (Monday-Friday 8 am - 5 pm) [phone] (afterhours)

## 2015-02-28 ENCOUNTER — Encounter: Payer: Medicare Other | Admitting: Internal Medicine

## 2015-02-28 ENCOUNTER — Other Ambulatory Visit: Payer: Self-pay | Admitting: Neurological Surgery

## 2015-02-28 DIAGNOSIS — G919 Hydrocephalus, unspecified: Secondary | ICD-10-CM

## 2015-03-06 ENCOUNTER — Non-Acute Institutional Stay (SKILLED_NURSING_FACILITY): Payer: Medicare Other | Admitting: Adult Health

## 2015-03-06 ENCOUNTER — Encounter: Payer: Self-pay | Admitting: Adult Health

## 2015-03-06 DIAGNOSIS — Z982 Presence of cerebrospinal fluid drainage device: Secondary | ICD-10-CM | POA: Diagnosis not present

## 2015-03-06 DIAGNOSIS — I10 Essential (primary) hypertension: Secondary | ICD-10-CM | POA: Diagnosis not present

## 2015-03-06 DIAGNOSIS — J309 Allergic rhinitis, unspecified: Secondary | ICD-10-CM | POA: Diagnosis not present

## 2015-03-06 DIAGNOSIS — F329 Major depressive disorder, single episode, unspecified: Secondary | ICD-10-CM

## 2015-03-06 DIAGNOSIS — T859XXS Unspecified complication of internal prosthetic device, implant and graft, sequela: Secondary | ICD-10-CM | POA: Diagnosis not present

## 2015-03-06 DIAGNOSIS — D509 Iron deficiency anemia, unspecified: Secondary | ICD-10-CM | POA: Diagnosis not present

## 2015-03-06 DIAGNOSIS — F32A Depression, unspecified: Secondary | ICD-10-CM

## 2015-03-06 DIAGNOSIS — T85618S Breakdown (mechanical) of other specified internal prosthetic devices, implants and grafts, sequela: Secondary | ICD-10-CM

## 2015-03-06 NOTE — Progress Notes (Addendum)
Patient ID: Maureen Ortiz, female   DOB: 28-Jun-1951, 64 y.o.   MRN: [id number]   03/06/2015  Facility:  Nursing Home Location:  [facility] Room Number: 1201-P LEVEL OF CARE:  SNF (31)   Chief Complaint  Patient presents with  . Discharge Note    Physical deconditioning, shunt malfunction S/P ventricular shunt placement, hypertension, anemia, depression and allergic rhinitis    HISTORY OF PRESENT ILLNESS:   This is a 64 year old female who is for discharge to [facility]. She has been admitted to [facility] on 02/15/15 from [facility]. She has PMH of DVT, hard of hearing, congenital hydrocephalus, seizure, major depression and vertigo. She was having aphasia and frequent falls. Head CT showed a broken shunt with hydrocephalus. She had VPS placement on 5/27.   Patient was admitted to this facility for short-term rehabilitation after the patient's recent hospitalization.  Patient has completed SNF rehabilitation and therapy has cleared the patient for discharge  PAST MEDICAL HISTORY:  Past Medical History  Diagnosis Date  . Falls frequently   . Hydrocephalus   . Hard of hearing   . Depression   . Incontinent of urine   . DVT of lower extremity (deep venous thrombosis)   . Hypertension   . Seizures   . Vertigo     CURRENT MEDICATIONS: Reviewed per MAR/see medication list  Allergies  Allergen Reactions  . Codeine Nausea And Vomiting     REVIEW OF SYSTEMS:   GENERAL: no change in appetite, no fatigue, no weight changes, no fever, chills or weakness RESPIRATORY: no cough, SOB, DOE, wheezing, hemoptysis CARDIAC: no chest pain, edema or palpitations GI: no abdominal pain, diarrhea, constipation, heart burn, nausea or vomiting  PHYSICAL EXAMINATION  GENERAL: no acute distress, normal body habitus SKIN:  Staples to right scalp, palpable shunt on bilateral sides of head NECK: supple, trachea midline, no neck masses, no  thyroid tenderness, no thyromegaly LYMPHATICS: no LAN in the neck, no supraclavicular LAN RESPIRATORY: breathing is even & unlabored, BS CTAB CARDIAC: RRR, no murmur,no extra heart sounds, no edema GI: abdomen soft, normal BS, no masses, no tenderness, no hepatomegaly, no splenomegaly EXTREMITIES:  Able to move X 4 extremities; limited ROM to bilateral shoulders PSYCHIATRIC: the patient is alert & oriented to person, affect & behavior appropriate  LABS/RADIOLOGY: Labs reviewed: 02/19/15  WBC 5.2 hemoglobin 11.4 hematocrit 36.7 MCV 79.1 platelet 395 potassium 4.4 sodium 141 glucose 65 BUN 16 creatinine 0.56 calcium 8.6 Basic Metabolic Panel:  Recent Labs  40/98/11 2221 02/07/15 1140  NA 139 138  K 4.2 3.7  CL 102 100*  CO2 20* 26  GLUCOSE 229* 129*  BUN 22* 13  CREATININE 1.07* 0.76  CALCIUM 9.6 9.6   Liver Function Tests:  Recent Labs  02/03/15 2221 02/07/15 1140  AST 25 16  ALT 12* 13*  ALKPHOS 77 72  BILITOT 0.9 0.8  PROT 7.6 6.5  ALBUMIN 4.4 3.8    Recent Labs  02/03/15 2221  LIPASE 20*   CBC:  Recent Labs  02/03/15 2221 02/07/15 1140  WBC 10.4 10.2  NEUTROABS 7.7 9.1*  HGB 11.6* 12.1  HCT 37.9 38.4  MCV 77.3* 76.8*  PLT 356 345   Cardiac Enzymes:  Recent Labs  02/03/15 2221  TROPONINI <0.03   CBG:  Recent Labs  02/11/15 1939 02/11/15 2327 02/12/15 0323  GLUCAP 88 88 83    Ct Head Wo Contrast  02/10/2015   CLINICAL DATA:  Patient found on floor several days ago with increasing confusion. Patient has not returned to baseline, currently does not speak, does not appear to see, and does not follow commands.  EXAM: CT HEAD WITHOUT CONTRAST  TECHNIQUE: Contiguous axial images were obtained from the base of the skull through the vertex without intravenous contrast.  COMPARISON:  Multiple priors, most recent 02/07/2015.  FINDINGS: Since the prior study, the patient has undergone placement of a RIGHT ventriculoperitoneal shunt via a RIGHT posterior  approach. Shunt catheter tip in good position. Slight pneumocephalus, non worrisome. Biventricular diameter as measured on today's image 16 appears diminished from 05/25, now 68 mm as compared to 74 mm previous. No dramatic difference in the enlarged fourth ventricle; it is unclear if this structure communicates with the lateral ventricles.  IMPRESSION: Improved appearance following RIGHT ventriculoperitoneal shunting. Decrease in biventricular diameter.  No dramatic difference in the enlarged fourth ventricle; it is unclear if this communicates with lateral ventricles.   Electronically Signed   By: Davonna Belling M.D.   On: 02/10/2015 11:27   Ct Head Wo Contrast  02/07/2015   CLINICAL DATA:  Patient found on floor with increased confusion and hematoma right forehead.  EXAM: CT HEAD WITHOUT CONTRAST  CT CERVICAL SPINE WITHOUT CONTRAST  TECHNIQUE: Multidetector CT imaging of the head and cervical spine was performed following the standard protocol without intravenous contrast. Multiplanar CT image reconstructions of the cervical spine were also generated.  COMPARISON:  08/08/2013 and 03/09/2013  FINDINGS: CT HEAD FINDINGS  Previous suboccipital craniectomy with postsurgical changes stable. Prior left frontoparietal craniotomy unchanged. 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 Left occipital ventriculostomy catheter unchanged. The intracranial segment of the left frontal ventriculostomy catheter is no longer seen, however there is a segment of ventriculostomy catheter lying within the mid to posterior body of the right lateral ventricle which may represent the fractured segment of the previously identified left ventriculostomy catheter.  There has been interval enlargement in the ventricles. No change in moderate white matter low-attenuation over the left hemisphere. No change in a large elliptical calcified extra-axial structure over the right frontal region. Postsurgical defect over the right  frontoparietal skull unchanged. No evidence of acute hemorrhage or acute infarction. No significant midline shift.  CT CERVICAL SPINE FINDINGS  There straightening of the normal cervical lordosis. There is mild spondylosis throughout the cervical spine. There is disc space narrowing from the C5-6 level to the C7-T1 level unchanged. Prevertebral soft tissues are within normal. Atlantoaxial articulation is normal. No acute fracture or subluxation. Remainder the exam is unchanged.  IMPRESSION: Since the prior study from 08/08/2013, there has been interval enlargement of the ventricles. There are also findings suggesting fracture of the intracranial segment of the previously seen left frontal ventriculostomy catheter as this segment of catheter now lies within the mid to posterior body of the right lateral ventricle.  Other chronic and postsurgical findings unchanged.  No acute cervical spine injury.  Mild spondylosis of the cervical spine with disc disease from the C5-6 level to the C7-T1 level.  These results were called by telephone at the time of interpretation on 02/07/2015 at 1:18 pm to Dr. Lorre Nick , who verbally acknowledged these results.   Electronically Signed   By: Elberta Fortis M.D.   On: 02/07/2015 13:19   Ct Cervical Spine Wo Contrast  02/07/2015   CLINICAL DATA:  Patient found on floor with increased confusion and hematoma right forehead.  EXAM: CT HEAD WITHOUT CONTRAST  CT CERVICAL  SPINE WITHOUT CONTRAST  TECHNIQUE: Multidetector CT imaging of the head and cervical spine was performed following the standard protocol without intravenous contrast. Multiplanar CT image reconstructions of the cervical spine were also generated.  COMPARISON:  08/08/2013 and 03/09/2013  FINDINGS: CT HEAD FINDINGS  Previous suboccipital craniectomy with postsurgical changes stable. Prior left frontoparietal craniotomy unchanged. 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 Left occipital ventriculostomy  catheter unchanged. The intracranial segment of the left frontal ventriculostomy catheter is no longer seen, however there is a segment of ventriculostomy catheter lying within the mid to posterior body of the right lateral ventricle which may represent the fractured segment of the previously identified left ventriculostomy catheter.  There has been interval enlargement in the ventricles. No change in moderate white matter low-attenuation over the left hemisphere. No change in a large elliptical calcified extra-axial structure over the right frontal region. Postsurgical defect over the right frontoparietal skull unchanged. No evidence of acute hemorrhage or acute infarction. No significant midline shift.  CT CERVICAL SPINE FINDINGS  There straightening of the normal cervical lordosis. There is mild spondylosis throughout the cervical spine. There is disc space narrowing from the C5-6 level to the C7-T1 level unchanged. Prevertebral soft tissues are within normal. Atlantoaxial articulation is normal. No acute fracture or subluxation. Remainder the exam is unchanged.  IMPRESSION: Since the prior study from 08/08/2013, there has been interval enlargement of the ventricles. There are also findings suggesting fracture of the intracranial segment of the previously seen left frontal ventriculostomy catheter as this segment of catheter now lies within the mid to posterior body of the right lateral ventricle.  Other chronic and postsurgical findings unchanged.  No acute cervical spine injury.  Mild spondylosis of the cervical spine with disc disease from the C5-6 level to the C7-T1 level.  These results were called by telephone at the time of interpretation on 02/07/2015 at 1:18 pm to Dr. Lorre Nick , who verbally acknowledged these results.   Electronically Signed   By: Elberta Fortis M.D.   On: 02/07/2015 13:19    ASSESSMENT/PLAN:  Shunt malfunction S/P ventricular shunt placement - follow-up with Dr. Marikay Alar,  neurosurgery Hypertension -   continue Cozaar 50 mg 1 tab by mouth daily and recently added Cozaar 25 mg PO Q PM Anemia, microcytic -  hgb 11.4; continue FeSO4 325 mg 1 tab PO Q daily Depression - mood is stable; continue Prozac 40 mg PO Q AM Allergic rhinitis - continue Claritin 10 mg 1 tab PO Q D   I have filled out patient's discharge paperwork.   Total discharge time: Less than 30 minutes  Discharge time involved coordination of the discharge process with Child psychotherapist, nursing staff and therapy department.    [facility], NP [employer] [phone]

## 2015-03-21 ENCOUNTER — Ambulatory Visit
Admission: RE | Admit: 2015-03-21 | Discharge: 2015-03-21 | Disposition: A | Discharge status: Home / Self Care | Payer: Medicare Other | Source: Ambulatory Visit | Attending: Neurological Surgery | Admitting: Neurological Surgery

## 2015-03-21 DIAGNOSIS — G919 Hydrocephalus, unspecified: Secondary | ICD-10-CM

## 2015-07-04 ENCOUNTER — Encounter: Payer: Self-pay | Admitting: Gastroenterology

## 2015-08-28 ENCOUNTER — Emergency Department (HOSPITAL_COMMUNITY)
Admission: EM | Admit: 2015-08-28 | Discharge: 2015-08-28 | Disposition: A | Discharge status: Home / Self Care | Payer: Medicare Other | Attending: Emergency Medicine | Admitting: Emergency Medicine

## 2015-08-28 ENCOUNTER — Emergency Department (HOSPITAL_COMMUNITY): Payer: Medicare Other

## 2015-08-28 ENCOUNTER — Encounter (HOSPITAL_COMMUNITY): Payer: Self-pay | Admitting: Emergency Medicine

## 2015-08-28 DIAGNOSIS — F329 Major depressive disorder, single episode, unspecified: Secondary | ICD-10-CM | POA: Diagnosis not present

## 2015-08-28 DIAGNOSIS — H9190 Unspecified hearing loss, unspecified ear: Secondary | ICD-10-CM | POA: Diagnosis not present

## 2015-08-28 DIAGNOSIS — R4182 Altered mental status, unspecified: Secondary | ICD-10-CM | POA: Diagnosis present

## 2015-08-28 DIAGNOSIS — F801 Expressive language disorder: Secondary | ICD-10-CM | POA: Diagnosis not present

## 2015-08-28 DIAGNOSIS — R4701 Aphasia: Secondary | ICD-10-CM

## 2015-08-28 DIAGNOSIS — R51 Headache: Secondary | ICD-10-CM | POA: Insufficient documentation

## 2015-08-28 DIAGNOSIS — Z86718 Personal history of other venous thrombosis and embolism: Secondary | ICD-10-CM | POA: Diagnosis not present

## 2015-08-28 DIAGNOSIS — I10 Essential (primary) hypertension: Secondary | ICD-10-CM | POA: Insufficient documentation

## 2015-08-28 DIAGNOSIS — Z9621 Cochlear implant status: Secondary | ICD-10-CM | POA: Insufficient documentation

## 2015-08-28 DIAGNOSIS — Z982 Presence of cerebrospinal fluid drainage device: Secondary | ICD-10-CM | POA: Diagnosis not present

## 2015-08-28 DIAGNOSIS — Z79899 Other long term (current) drug therapy: Secondary | ICD-10-CM | POA: Insufficient documentation

## 2015-08-28 DIAGNOSIS — R519 Headache, unspecified: Secondary | ICD-10-CM

## 2015-08-28 LAB — BASIC METABOLIC PANEL
ANION GAP: 12 (ref 5–15)
BUN: 12 mg/dL (ref 6–20)
CO2: 25 mmol/L (ref 22–32)
CREATININE: 0.58 mg/dL (ref 0.44–1.00)
Calcium: 9.2 mg/dL (ref 8.9–10.3)
Chloride: 100 mmol/L — ABNORMAL LOW (ref 101–111)
Glucose, Bld: 97 mg/dL (ref 65–99)
Potassium: 4.3 mmol/L (ref 3.5–5.1)
SODIUM: 137 mmol/L (ref 135–145)

## 2015-08-28 LAB — CBC WITH DIFFERENTIAL/PLATELET
Basophils Absolute: 0 10*3/uL (ref 0.0–0.1)
Basophils Relative: 0 %
EOS PCT: 1 %
Eosinophils Absolute: 0.1 10*3/uL (ref 0.0–0.7)
HCT: 49.5 % — ABNORMAL HIGH (ref 36.0–46.0)
Hemoglobin: 16.4 g/dL — ABNORMAL HIGH (ref 12.0–15.0)
LYMPHS ABS: 1.3 10*3/uL (ref 0.7–4.0)
LYMPHS PCT: 13 %
MCH: 30.6 pg (ref 26.0–34.0)
MCHC: 33.1 g/dL (ref 30.0–36.0)
MCV: 92.4 fL (ref 78.0–100.0)
MONO ABS: 0.8 10*3/uL (ref 0.1–1.0)
MONOS PCT: 8 %
NEUTROS ABS: 7.9 10*3/uL — AB (ref 1.7–7.7)
Neutrophils Relative %: 78 %
PLATELETS: 279 10*3/uL (ref 150–400)
RBC: 5.36 MIL/uL — ABNORMAL HIGH (ref 3.87–5.11)
RDW: 14 % (ref 11.5–15.5)
WBC: 10 10*3/uL (ref 4.0–10.5)

## 2015-08-28 MED ORDER — SODIUM CHLORIDE 0.9 % IV BOLUS (SEPSIS)
1000.0000 mL | Freq: Once | INTRAVENOUS | Status: AC
  Administered 2015-08-28: 1000 mL via INTRAVENOUS

## 2015-08-28 NOTE — Consult Note (Signed)
NW:GNFAOZHYCc:Possible shunt malfunction, slurred speech, worsening aphasia Maureen Ortiz is a patient of Dr. Yetta BarreJones. She was last admitted [facility] in May of this year and underwent a shunt revision. A new Codman Hakim shunt was placed in the right parietal region, set to a pressure of 120mm h2o. She had a followup scan in July which looked quite good, with a decrease in ventricular size. She presented to the ED today with a  48-72hr decline according to family with increasing dysarthria, and expressive aphasia. She currently resides in a SNF. Brought to the ED by the nursing home a head ct revealed enlarged ventricles(compared to July). I was asked to see Maureen Ortiz.  Allergies  Allergen Reactions  . Codeine Nausea And Vomiting   Past Medical History  Diagnosis Date  . Falls frequently   . Hydrocephalus   . Hard of hearing   . Depression   . Incontinent of urine   . DVT of lower extremity (deep venous thrombosis) (HCC)   . Hypertension   . Seizures (HCC)   . Vertigo    Past Surgical History  Procedure Laterality Date  . Csf shunt    . Breast biopsy      benign x 2  . Brain surgery      multiple  . Paraovarian cyst removal    . Tubal ligation    . Knee surgery      x 3  . Cochlear implant      left  . Ventriculoperitoneal shunt Right 02/09/2015    Procedure: SHUNT INSERTION VENTRICULAR-PERITONEAL;  Surgeon: Tia Alertavid S Jones, MD;  Location: [facility];  Service: Neurosurgery;  Laterality: Right;  . Laparoscopic revision ventricular-peritoneal (v-p) shunt Right 02/09/2015    Procedure: LAPAROSCOPIC REVISION VENTRICULAR-PERITONEAL (V-P) SHUNT;  Surgeon: Tia Alertavid S Jones, MD;  Location: [facility]UR[facility];  Service: Neurosurgery;  Laterality: Right;   Prior to Admission medications   Medication Sig Start Date End Date Taking? Authorizing Provider  buPROPion (WELLBUTRIN XL) 150 MG 24 hr tablet Take 150 mg by mouth daily.   Yes Historical Provider, MD  cholecalciferol (VITAMIN D) 1000 UNITS tablet  Take 1,000 Units by mouth daily.   Yes Historical Provider, MD  ferrous sulfate 325 (65 FE) MG EC tablet Take 325 mg by mouth daily.     Yes Historical Provider, MD  FLUoxetine HCl 60 MG TABS Take 1 tablet by mouth daily.   Yes Historical Provider, MD  loratadine (CLARITIN) 10 MG tablet Take 10 mg by mouth daily as needed for allergies.   Yes Historical Provider, MD  LORazepam (ATIVAN) 1 MG tablet Take 1 mg by mouth every 8 (eight) hours.   Yes Historical Provider, MD  losartan (COZAAR) 50 MG tablet Take 50 mg by mouth daily.   Yes Historical Provider, MD  LUTEIN PO Take 1 tablet by mouth daily. 18mg -0.4mg -250mcg   Yes Historical Provider, MD  Multiple Vitamins-Minerals (MULTIVITAMINS THER. W/MINERALS) TABS Take 1 tablet by mouth daily.     Yes Historical Provider, MD   Family History  Problem Relation Age of Onset  . Stroke Mother   . Heart disease Father   . Heart disease Mother   . Colon cancer Neg Hx    Social History   Social History  . Marital Status: Divorced    Spouse Name: N/A  . Number of Children: 1  . Years of Education: N/A   Occupational History  . Not on file.   Social History Main Topics  .  Smoking status: Never Smoker   . Smokeless tobacco: Never Used  . Alcohol Use: No  . Drug Use: No  . Sexual Activity: Not on file   Other Topics Concern  . Not on file   Social History Narrative   Results for orders placed or performed during the hospital encounter of 08/28/15 (from the past 24 hour(s))  Basic metabolic panel     Status: Abnormal   Collection Time: 08/28/15  4:05 PM  Result Value Ref Range   Sodium 137 135 - 145 mmol/L   Potassium 4.3 3.5 - 5.1 mmol/L   Chloride 100 (L) 101 - 111 mmol/L   CO2 25 22 - 32 mmol/L   Glucose, Bld 97 65 - 99 mg/dL   BUN 12 6 - 20 mg/dL   Creatinine, Ser 1.61 0.44 - 1.00 mg/dL   Calcium 9.2 8.9 - 09.6 mg/dL   GFR calc non Af Amer >60 >60 mL/min   GFR calc Af Amer >60 >60 mL/min   Anion gap 12 5 - 15  CBC with  Differential     Status: Abnormal   Collection Time: 08/28/15  4:05 PM  Result Value Ref Range   WBC 10.0 4.0 - 10.5 K/uL   RBC 5.36 (H) 3.87 - 5.11 MIL/uL   Hemoglobin 16.4 (H) 12.0 - 15.0 g/dL   HCT 04.5 (H) 40.9 - 81.1 %   MCV 92.4 78.0 - 100.0 fL   MCH 30.6 26.0 - 34.0 pg   MCHC 33.1 30.0 - 36.0 g/dL   RDW 91.4 78.2 - 95.6 %   Platelets 279 150 - 400 K/uL   Neutrophils Relative % 78 %   Neutro Abs 7.9 (H) 1.7 - 7.7 K/uL   Lymphocytes Relative 13 %   Lymphs Abs 1.3 0.7 - 4.0 K/uL   Monocytes Relative 8 %   Monocytes Absolute 0.8 0.1 - 1.0 K/uL   Eosinophils Relative 1 %   Eosinophils Absolute 0.1 0.0 - 0.7 K/uL   Basophils Relative 0 %   Basophils Absolute 0.0 0.0 - 0.1 K/uL   Dg Skull 1-3 Views  08/28/2015  CLINICAL DATA:  Patient status post ventriculostomy shunt catheter in May, 2016 with swelling about the shunt. Initial encounter. EXAM: SKULL - 1-3 VIEW COMPARISON:  Head CT scan 08/28/2015 and 02/10/2015. FINDINGS: Three ventriculostomy shunt catheters are identified. One of the catheters is been cup at the level of the left lung apex. The at the catheters are intact. The patient is status post bilateral craniotomy. A large rim enhancing collection on the right is again seen as on prior CT. The patient has hearing aids in place. IMPRESSION: Shunt catheters appear intact. Electronically Signed   By: Drusilla Kanner M.D.   On: 08/28/2015 17:58   Dg Chest 1 View  08/28/2015  CLINICAL DATA:  Headache with swelling around shunt site, alert and awake but nonverbal, altered mental status versus baseline EXAM: CHEST 1 VIEW COMPARISON:  03/09/2013 Correlation CT abdomen and pelvis 02/03/2015 FINDINGS: Paired VP shunt catheter tubing traverses chest. Additional calcified tubing traverses LEFT chest. Normal heart size and mediastinal contours. Mildly rotated to the LEFT. Large opacity at inferior LEFT hemi thorax corresponds to large hiatal hernia on prior CT. Lungs grossly clear. No  pleural effusion or pneumothorax. Bones demineralized with multiple old LEFT rib fractures again seen. IMPRESSION: Large hiatal hernia. No acute abnormalities. Electronically Signed   By: Ulyses Southward M.D.   On: 08/28/2015 17:57   Dg Cervical Spine 1 View  08/28/2015  CLINICAL DATA:  Check shunt integrity, recent change in mental status EXAM: CERVICAL SPINE 1 VIEW COMPARISON:  None. FINDINGS: Degenerative changes of the cervical spine are seen. An intact shunt catheter is noted on the right extending along the anterior chest wall. Multiple previous shunt catheter fragments are noted on the left. The lungs are clear. Hiatal hernia is again noted. IMPRESSION: Intact shunt catheter on the right Electronically Signed   By: Alcide Clever M.D.   On: 08/28/2015 17:54   Dg Abd 1 View  08/28/2015  CLINICAL DATA:  Patient status post ventriculostomy shunt catheter placement in the May, 2016. Headache and swelling about the shunt. Initial encounter. EXAM: ABDOMEN - 1 VIEW COMPARISON:  CT abdomen and pelvis 02/03/2015. FINDINGS: Three sets of tubing are identified projecting in the abdomen. All image tubing appears intact. The bowel gas pattern is nonobstructive. The patient has a very large volume of stool throughout the colon with a massive stool ball present in the rectum. IMPRESSION: Three sets of tubing are identified in the abdomen consistent with ventriculostomy shunt catheters. All imaged tubing appears intact. Large volume of stool throughout the colon with a very large stool ball in the rectum. Electronically Signed   By: Drusilla Kanner M.D.   On: 08/28/2015 17:55   Ct Head Wo Contrast  08/28/2015  CLINICAL DATA:  Headache with swelling around shunts tight, new shot placed in May, increased in altered mental status and slurred speech, prior cochlear implant and brain surgery EXAM: CT HEAD WITHOUT CONTRAST TECHNIQUE: Contiguous axial images were obtained from the base of the skull through the vertex without  intravenous contrast. COMPARISON:  03/21/2015 FINDINGS: LEFT cochlear implant. Prior occipital/suboccipital craniotomy. RIGHT-side hearing aid. Ventricular shunt from BILATERAL parietal approaches are again identified with tips within the lateral ventricles bilaterally. Additional shunt fragment at temporal horn of RIGHT lateral ventricle. Large extra-axial collection with calcified rim at the RIGHT frontoparietal region again identified 8.5 x 3.8 cm grossly unchanged. Generalized cerebral atrophy with scattered areas of white matter hypoattenuation. Persistent dilatation of the ventricular system diffusely, with lateral ventricles appearing more distended than on previous exam. Large areas of encephalomalacia in the LEFT hemisphere stable. No definite acute intracranial hemorrhage, new mass or new infarct. Atherosclerotic calcifications of internal carotid and vertebral arteries at skullbase. IMPRESSION: BILATERAL intraventricular shunts with additional shunt fragment at temporal horn of RIGHT lateral ventricle. Diffuse ventriculomegaly increased since 03/21/2015. Diffuse cerebral atrophy with LEFT hemispheric encephalomalacia and stable large extra-axial fluid collection with calcified rim in RIGHT frontal parietal region. Electronically Signed   By: Ulyses Southward M.D.   On: 08/28/2015 16:37   Physical Exam  Constitutional: She appears well-developed.  HENT:  Mouth/Throat: Oropharynx is clear and moist.  Multiple cranial incisions, multiple cranial defects No evidence acute trauma  Eyes: EOM are normal. Pupils are equal, round, and reactive to light.  Neck: Normal range of motion. Neck supple.  Cardiovascular: Normal rate and regular rhythm.   Musculoskeletal: Normal range of motion.  Neurological: She is alert.  Speech is dysarthric, she does follow commands. Is moving all extremities, left more so than right. No particular weakness however when examined. Unable to do a very detailed exam due to  mental status. Weakness in lower extremites She according to family is at baseline.   Nursing note and vitals reviewed. A/P I changed the valve pressure to h2o in the hope that this will result in more csf drainage. I will speak with Dr. Yetta Barre tomorrow.  Certainly ok to be discharged from ed. No neurologic crisis appreciated.

## 2015-08-28 NOTE — ED Provider Notes (Signed)
CSN: [id number]     Arrival date & time 08/28/15  1533 History   First MD Initiated Contact with Patient 08/28/15 1555     Chief Complaint  Patient presents with  . Altered Mental Status  . Shunt swelling      (Consider location/radiation/quality/duration/timing/severity/associated sxs/prior Treatment) HPI Comments: 64 year old female with history of hydrocephalus, DVT, seizures, recent shunt replacement in May by Dr. Yetta Barre presents for worsening mental status and slurred speech. Patient normally has "good and bad days" however they have noticed more difficulty with her speech past few days no fevers or chills. No known stroke history. No focal unilateral deficits no facial droop. No other changes per report.  Patient is a 64 y.o. female presenting with altered mental status. The history is provided by the patient, a relative and medical records.  Altered Mental Status   Past Medical History  Diagnosis Date  . Falls frequently   . Hydrocephalus   . Hard of hearing   . Depression   . Incontinent of urine   . DVT of lower extremity (deep venous thrombosis) (HCC)   . Hypertension   . Seizures (HCC)   . Vertigo    Past Surgical History  Procedure Laterality Date  . Csf shunt    . Breast biopsy      benign x 2  . Brain surgery      multiple  . Paraovarian cyst removal    . Tubal ligation    . Knee surgery      x 3  . Cochlear implant      left  . Ventriculoperitoneal shunt Right 02/09/2015    Procedure: SHUNT INSERTION VENTRICULAR-PERITONEAL;  Surgeon: Tia Alert, MD;  Location: [facility];  Service: Neurosurgery;  Laterality: Right;  . Laparoscopic revision ventricular-peritoneal (v-p) shunt Right 02/09/2015    Procedure: LAPAROSCOPIC REVISION VENTRICULAR-PERITONEAL (V-P) SHUNT;  Surgeon: Tia Alert, MD;  Location: [facility];  Service: Neurosurgery;  Laterality: Right;   Family History  Problem Relation Age of Onset  . Stroke Mother   . Heart disease Father    . Heart disease Mother   . Colon cancer Neg Hx    Social History  Substance Use Topics  . Smoking status: Never Smoker   . Smokeless tobacco: Never Used  . Alcohol Use: No   OB History    No data available     Review of Systems  Unable to perform ROS: Mental status change      Allergies  Codeine  Home Medications   Prior to Admission medications   Medication Sig Start Date End Date Taking? Authorizing Provider  buPROPion (WELLBUTRIN XL) 150 MG 24 hr tablet Take 150 mg by mouth daily.   Yes Historical Provider, MD  cholecalciferol (VITAMIN D) 1000 UNITS tablet Take 1,000 Units by mouth daily.   Yes Historical Provider, MD  ferrous sulfate 325 (65 FE) MG EC tablet Take 325 mg by mouth daily.     Yes Historical Provider, MD  FLUoxetine HCl 60 MG TABS Take 1 tablet by mouth daily.   Yes Historical Provider, MD  loratadine (CLARITIN) 10 MG tablet Take 10 mg by mouth daily as needed for allergies.   Yes Historical Provider, MD  LORazepam (ATIVAN) 1 MG tablet Take 1 mg by mouth every 8 (eight) hours.   Yes Historical Provider, MD  losartan (COZAAR) 50 MG tablet Take 50 mg by mouth daily.   Yes Historical Provider, MD  LUTEIN PO Take 1 tablet  by mouth daily. 18mg -0.4mg -   Yes Historical Provider, MD  Multiple Vitamins-Minerals (MULTIVITAMINS THER. W/MINERALS) TABS Take 1 tablet by mouth daily.     Yes Historical Provider, MD   BP 131/95 mmHg  Pulse 83  Temp(Src) 98.4 F (36.9 C) (Oral)  Resp 18  Ht 5\' 3"  (1.6 m)  Wt 130 lb (58.968 kg)  BMI 23.03 kg/m2  SpO2 100% Physical Exam  Constitutional: She appears well-developed and well-nourished.  HENT:  Head: Normocephalic and atraumatic.  Dry mm  Eyes: Right eye exhibits no discharge. Left eye exhibits no discharge.  Neck: Normal range of motion. Neck supple. No tracheal deviation present.  Cardiovascular: Normal rate and regular rhythm.   Pulmonary/Chest: Effort normal and breath sounds normal.  Abdominal: Soft. She  exhibits no distension. There is no tenderness. There is no guarding.  Musculoskeletal: She exhibits no edema.  Neurological: She is alert.  Moderate expressive aphasia Patient has general weakness, moves extremities fairly equal bilateral mild decrease in the right lower extremity. Gross sensation intact. PERRL.  Horiz EOMI.  Neck supple.    Skin: Skin is warm. No rash noted.  Psychiatric: She is not agitated and not withdrawn.  Difficult to assess due to speech,   Nursing note and vitals reviewed.   ED Course  Procedures (including critical care time) Labs Review Labs Reviewed  BASIC METABOLIC PANEL - Abnormal; Notable for the following:    Chloride 100 (*)    All other components within normal limits  CBC WITH DIFFERENTIAL/PLATELET - Abnormal; Notable for the following:    RBC 5.36 (*)    Hemoglobin 16.4 (*)    HCT 49.5 (*)    Neutro Abs 7.9 (*)    All other components within normal limits  URINALYSIS, ROUTINE W REFLEX MICROSCOPIC (NOT AT [facility])    Imaging Review Dg Skull 1-3 Views  08/28/2015  CLINICAL DATA:  Patient status post ventriculostomy shunt catheter in May, 2016 with swelling about the shunt. Initial encounter. EXAM: SKULL - 1-3 VIEW COMPARISON:  Head CT scan 08/28/2015 and 02/10/2015. FINDINGS: Three ventriculostomy shunt catheters are identified. One of the catheters is been cup at the level of the left lung apex. The at the catheters are intact. The patient is status post bilateral craniotomy. A large rim enhancing collection on the right is again seen as on prior CT. The patient has hearing aids in place. IMPRESSION: Shunt catheters appear intact. Electronically Signed   By: Drusilla Kanner M.D.   On: 08/28/2015 17:58   Dg Chest 1 View  08/28/2015  CLINICAL DATA:  Headache with swelling around shunt site, alert and awake but nonverbal, altered mental status versus baseline EXAM: CHEST 1 VIEW COMPARISON:  03/09/2013 Correlation CT abdomen and pelvis 02/03/2015  FINDINGS: Paired VP shunt catheter tubing traverses chest. Additional calcified tubing traverses LEFT chest. Normal heart size and mediastinal contours. Mildly rotated to the LEFT. Large opacity at inferior LEFT hemi thorax corresponds to large hiatal hernia on prior CT. Lungs grossly clear. No pleural effusion or pneumothorax. Bones demineralized with multiple old LEFT rib fractures again seen. IMPRESSION: Large hiatal hernia. No acute abnormalities. Electronically Signed   By: Ulyses Southward M.D.   On: 08/28/2015 17:57   Dg Cervical Spine 1 View  08/28/2015  CLINICAL DATA:  Check shunt integrity, recent change in mental status EXAM: CERVICAL SPINE 1 VIEW COMPARISON:  None. FINDINGS: Degenerative changes of the cervical spine are seen. An intact shunt catheter is noted on the right extending along the  anterior chest wall. Multiple previous shunt catheter fragments are noted on the left. The lungs are clear. Hiatal hernia is again noted. IMPRESSION: Intact shunt catheter on the right Electronically Signed   By: Alcide CleverMark  Lukens M.D.   On: 08/28/2015 17:54   Dg Abd 1 View  08/28/2015  CLINICAL DATA:  Patient status post ventriculostomy shunt catheter placement in the May, 2016. Headache and swelling about the shunt. Initial encounter. EXAM: ABDOMEN - 1 VIEW COMPARISON:  CT abdomen and pelvis 02/03/2015. FINDINGS: Three sets of tubing are identified projecting in the abdomen. All image tubing appears intact. The bowel gas pattern is nonobstructive. The patient has a very large volume of stool throughout the colon with a massive stool ball present in the rectum. IMPRESSION: Three sets of tubing are identified in the abdomen consistent with ventriculostomy shunt catheters. All imaged tubing appears intact. Large volume of stool throughout the colon with a very large stool ball in the rectum. Electronically Signed   By: Drusilla Kannerhomas  Dalessio M.D.   On: 08/28/2015 17:55   Ct Head Wo Contrast  08/28/2015  CLINICAL DATA:   Headache with swelling around shunts tight, new shot placed in May, increased in altered mental status and slurred speech, prior cochlear implant and brain surgery EXAM: CT HEAD WITHOUT CONTRAST TECHNIQUE: Contiguous axial images were obtained from the base of the skull through the vertex without intravenous contrast. COMPARISON:  03/21/2015 FINDINGS: LEFT cochlear implant. Prior occipital/suboccipital craniotomy. RIGHT-side hearing aid. Ventricular shunt from BILATERAL parietal approaches are again identified with tips within the lateral ventricles bilaterally. Additional shunt fragment at temporal horn of RIGHT lateral ventricle. Large extra-axial collection with calcified rim at the RIGHT frontoparietal region again identified 8.5 x 3.8 cm grossly unchanged. Generalized cerebral atrophy with scattered areas of white matter hypoattenuation. Persistent dilatation of the ventricular system diffusely, with lateral ventricles appearing more distended than on previous exam. Large areas of encephalomalacia in the LEFT hemisphere stable. No definite acute intracranial hemorrhage, new mass or new infarct. Atherosclerotic calcifications of internal carotid and vertebral arteries at skullbase. IMPRESSION: BILATERAL intraventricular shunts with additional shunt fragment at temporal horn of RIGHT lateral ventricle. Diffuse ventriculomegaly increased since 03/21/2015. Diffuse cerebral atrophy with LEFT hemispheric encephalomalacia and stable large extra-axial fluid collection with calcified rim in RIGHT frontal parietal region. Electronically Signed   By: Ulyses SouthwardMark  Boles M.D.   On: 08/28/2015 16:37   I have personally reviewed and evaluated these images and lab results as part of my medical decision-making.   EKG Interpretation   Date/Time:  Tuesday August 28 2015 15:35:16 EST Ventricular Rate:  96 PR Interval:  96 QRS Duration: 102 QT Interval:  362 QTC Calculation: 457 R Axis:   87 Text Interpretation:  Sinus  rhythm Short PR interval Probable left atrial  enlargement Low voltage, precordial leads since last tracing no  significant change Confirmed by Effie ShyWENTZ  MD, Mechele CollinELLIOTT ([id number]) on 08/28/2015  3:53:04 PM      MDM   Final diagnoses:  Headache  Expressive aphasia   Patient presents with concern for shunt malfunction. Patient having mild changes mental status most specifically worsening expressive aphasia. CT scan showed worsening ventriculomegaly reviewed. Discussed with neurosurgeon on call who will assess the patient and try to make adjustments in the ER. Updated family.  Patient observed in the ER, Dr. Mikal Planeabell discussed the case with the family at bedside as well as myself in the ER. Adjustments were made and she will follow-up in his clinic.  Results and differential  diagnosis were discussed with the patient/parent/guardian. Xrays were independently reviewed by myself.  Close follow up outpatient was discussed, comfortable with the plan.   Medications  sodium chloride 0.9 % bolus 1,000 mL (1,000 mLs Intravenous New Bag/Given 08/28/15 1842)    Filed Vitals:   08/28/15 1700 08/28/15 1800 08/28/15 1830 08/28/15 1919  BP: 130/90 131/91 132/88 131/95  Pulse: 87 86 80 83  Temp:      TempSrc:      Resp: Height:      Weight:      SpO2: 97% 96% 97% 100%    Final diagnoses:  Headache  Expressive aphasia      Blane Ohara, MD 08/28/15 1930

## 2015-08-28 NOTE — ED Notes (Signed)
Dr Autumn Messingalbell at bedside.

## 2015-08-28 NOTE — ED Notes (Signed)
[facility].

## 2015-08-28 NOTE — ED Notes (Signed)
[facility] and swelling around shunt site. Pt had new shunt placed in May by Doctors Yetta BarreJones and Orvan Falconerampbell. Pt family reports an increase in altered mental status and slurred speech. Pt is alert and oriented to person, place.

## 2015-08-28 NOTE — Discharge Instructions (Signed)
Follow up in neurosurgery clinic as directed.  If you were given medicines take as directed.  If you are on coumadin or contraceptives realize their levels and effectiveness is altered by many different medicines.  If you have any reaction (rash, tongues swelling, other) to the medicines stop taking and see a physician.    If your blood pressure was elevated in the ER make sure you follow up for management with a primary doctor or return for chest pain, shortness of breath or stroke symptoms.  Please follow up as directed and return to the ER or see a physician for new or worsening symptoms.  Thank you. Filed Vitals:   08/28/15 1630 08/28/15 1700 08/28/15 1800 08/28/15 1830  BP: 126/83 130/90 131/91 132/88  Pulse: 88 87 86 80  Temp:      TempSrc:      Resp: 19 18 19 16   Height:      Weight:      SpO2: 97% 97% 96% 97%

## 2015-08-28 NOTE — ED Provider Notes (Signed)
MSE was initiated and I personally evaluated the patient and placed orders (if any) at  3:57 PM on August 28, 2015.  She has confusion for one week. She's been evaluated for psychiatric disorder. Was sent here for possible brain shunt malfunction.  The patient appears stable so that the remainder of the MSE may be completed by another provider.  Mancel BaleElliott Aleza Pew, MD 08/28/15 [phone]

## 2015-09-25 ENCOUNTER — Other Ambulatory Visit: Payer: Self-pay | Admitting: Neurological Surgery

## 2015-09-25 DIAGNOSIS — G919 Hydrocephalus, unspecified: Secondary | ICD-10-CM

## 2015-10-01 ENCOUNTER — Ambulatory Visit
Admission: RE | Admit: 2015-10-01 | Discharge: 2015-10-01 | Disposition: A | Discharge status: Home / Self Care | Payer: Medicare Other | Source: Ambulatory Visit | Attending: Neurological Surgery | Admitting: Neurological Surgery

## 2015-10-01 DIAGNOSIS — G919 Hydrocephalus, unspecified: Secondary | ICD-10-CM

## 2016-04-22 DIAGNOSIS — R6889 Other general symptoms and signs: Secondary | ICD-10-CM | POA: Diagnosis not present

## 2016-06-12 IMAGING — CT CT HEAD W/O CM
2 series · 16 of 30 positions shown, 18 images · non-contrast
Comparison: Multiple priors, most recent 02/07/2015.

CLINICAL DATA: Patient found on floor several days ago with
increasing confusion. Patient has not returned to baseline,
currently does not speak, does not appear to see, and does not
follow commands.

EXAM:
CT HEAD WITHOUT CONTRAST
TECHNIQUE: Contiguous axial images were obtained from the base of the skull
through the vertex without intravenous contrast.

[Series 201: head w/o, idose (1) · axial · non-contrast · 0.49mm/px · z∈[+104,+224]mm · 8 of 32 slices shown, 10 images]
[im 4/32  brain]
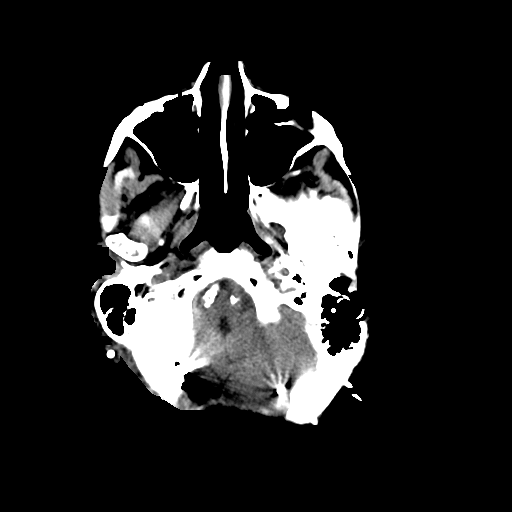
[im 4/32  bone]
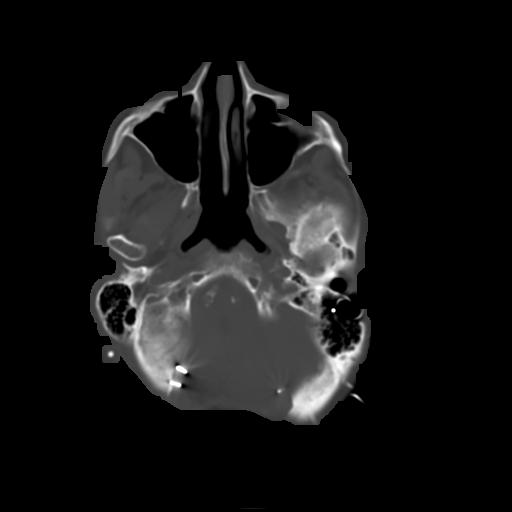
[im 7/32  brain]
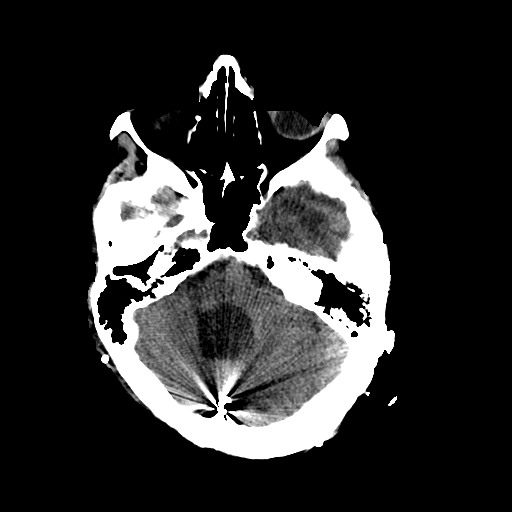
[im 11/32  brain]
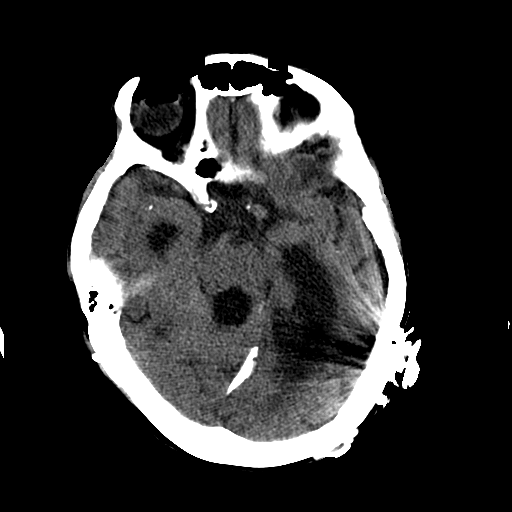
[im 14/32  brain]
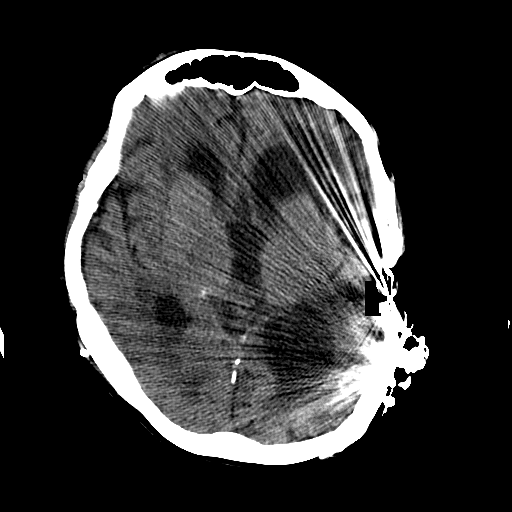
[im 18/32  brain]
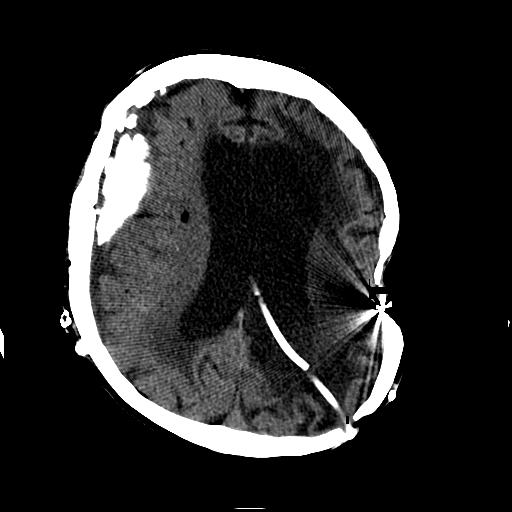
[im 18/32  bone]
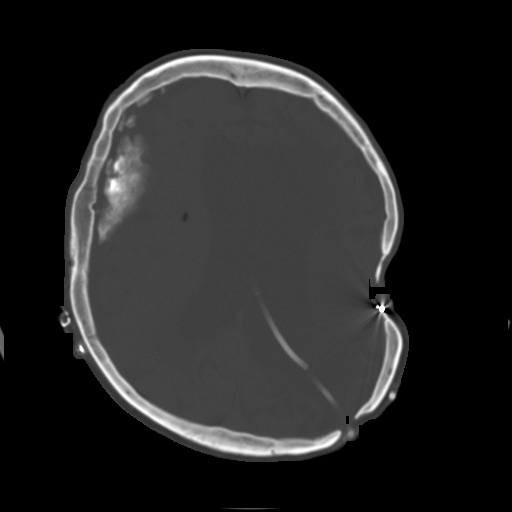
[im 21/32  brain]
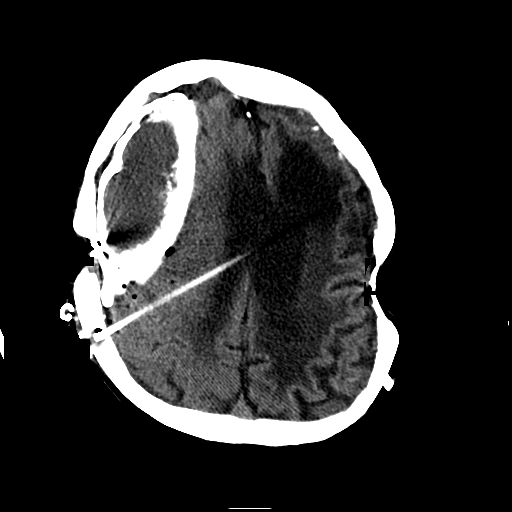
[im 25/32  brain]
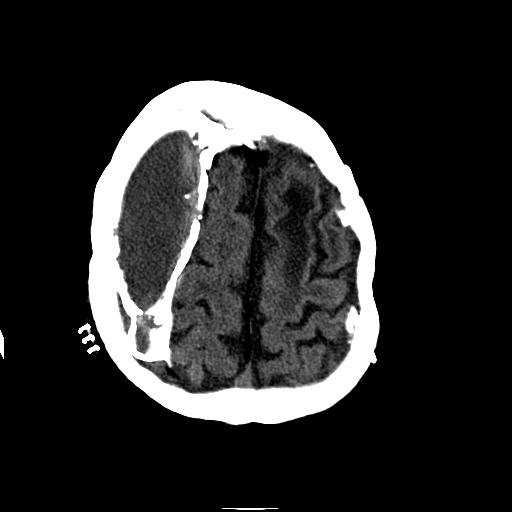
[im 28/32  brain]
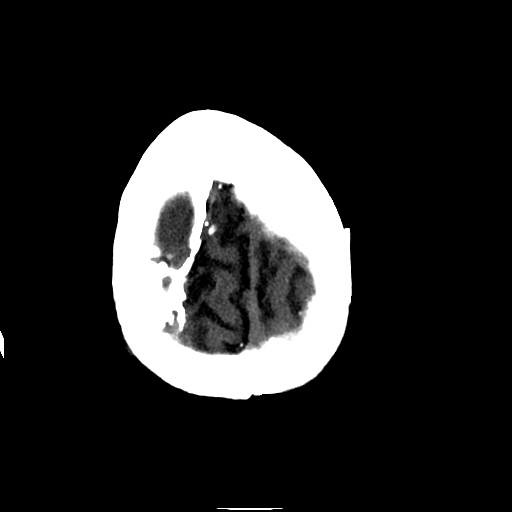

[Series 202: head w/o bone, idose (1) · axial · non-contrast · 0.49mm/px · z∈[+103,+235]mm · 8 of 64 slices shown]
[im 7/64  bone]
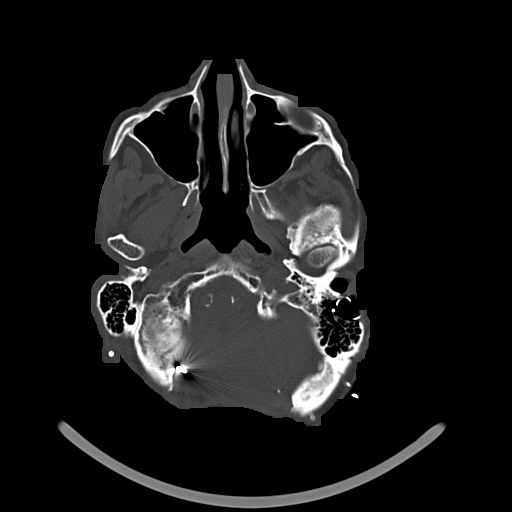
[im 14/64  bone]
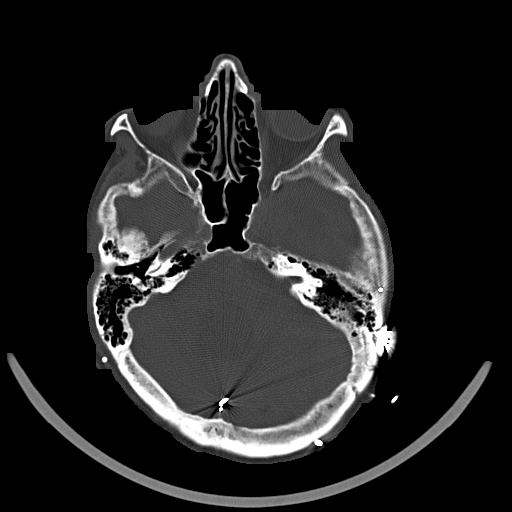
[im 20/64  bone]
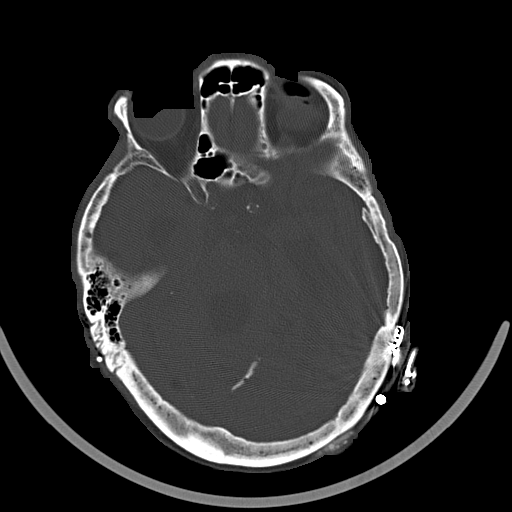
[im 27/64  bone]
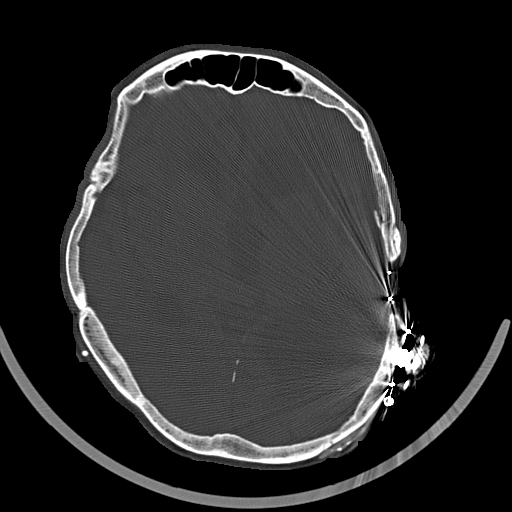
[im 34/64  bone]
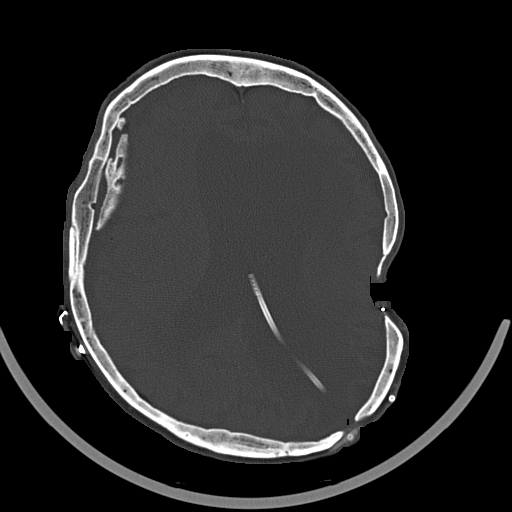
[im 47/64  bone]
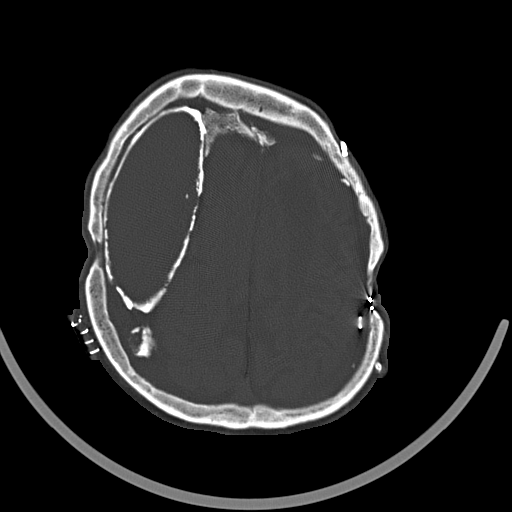
[im 54/64  bone]
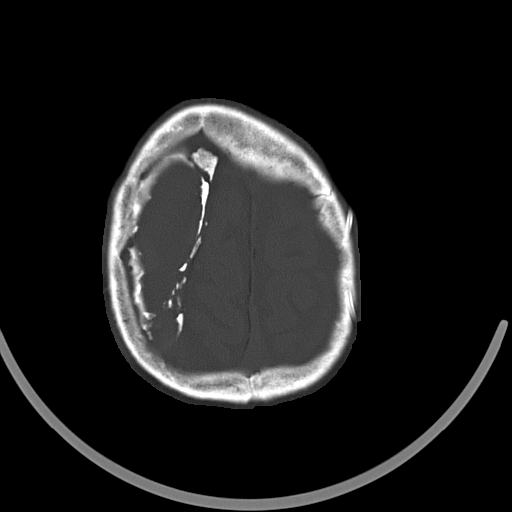
[im 60/64  bone]
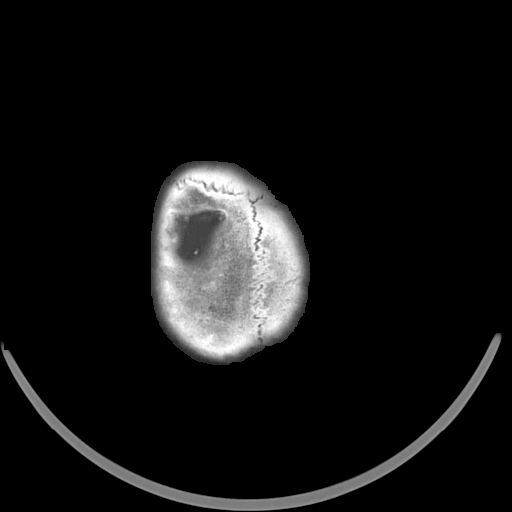

[16 of 30 positions shown; findings below may reference images not displayed]

FINDINGS: Since the prior study, the patient has undergone placement of a
RIGHT ventriculoperitoneal shunt via a RIGHT posterior approach.
Shunt catheter tip in good position. Slight pneumocephalus, non
worrisome. Biventricular diameter as measured on today's image 16
appears diminished from [DATE], now 68 mm as compared to 74 mm
previous. No dramatic difference in the enlarged fourth ventricle;
it is unclear if this structure communicates with the lateral
ventricles.
IMPRESSION: Improved appearance following RIGHT ventriculoperitoneal shunting.
Decrease in biventricular diameter.

No dramatic difference in the enlarged fourth ventricle; it is
unclear if this communicates with lateral ventricles.

## 2016-08-07 DIAGNOSIS — H903 Sensorineural hearing loss, bilateral: Secondary | ICD-10-CM | POA: Diagnosis not present

## 2016-08-21 DIAGNOSIS — N390 Urinary tract infection, site not specified: Secondary | ICD-10-CM | POA: Diagnosis not present

## 2016-08-21 DIAGNOSIS — R319 Hematuria, unspecified: Secondary | ICD-10-CM | POA: Diagnosis not present

## 2016-08-25 DIAGNOSIS — H903 Sensorineural hearing loss, bilateral: Secondary | ICD-10-CM | POA: Diagnosis not present

## 2016-10-01 DIAGNOSIS — R319 Hematuria, unspecified: Secondary | ICD-10-CM | POA: Diagnosis not present

## 2016-10-01 DIAGNOSIS — N390 Urinary tract infection, site not specified: Secondary | ICD-10-CM | POA: Diagnosis not present

## 2016-10-20 DIAGNOSIS — N390 Urinary tract infection, site not specified: Secondary | ICD-10-CM | POA: Diagnosis not present

## 2016-10-20 DIAGNOSIS — R319 Hematuria, unspecified: Secondary | ICD-10-CM | POA: Diagnosis not present

## 2016-12-16 DIAGNOSIS — M6281 Muscle weakness (generalized): Secondary | ICD-10-CM | POA: Diagnosis not present

## 2016-12-16 DIAGNOSIS — R270 Ataxia, unspecified: Secondary | ICD-10-CM | POA: Diagnosis not present

## 2016-12-16 DIAGNOSIS — R279 Unspecified lack of coordination: Secondary | ICD-10-CM | POA: Diagnosis not present

## 2016-12-18 DIAGNOSIS — R279 Unspecified lack of coordination: Secondary | ICD-10-CM | POA: Diagnosis not present

## 2016-12-18 DIAGNOSIS — R270 Ataxia, unspecified: Secondary | ICD-10-CM | POA: Diagnosis not present

## 2016-12-18 DIAGNOSIS — M6281 Muscle weakness (generalized): Secondary | ICD-10-CM | POA: Diagnosis not present

## 2016-12-25 DIAGNOSIS — R270 Ataxia, unspecified: Secondary | ICD-10-CM | POA: Diagnosis not present

## 2016-12-25 DIAGNOSIS — R279 Unspecified lack of coordination: Secondary | ICD-10-CM | POA: Diagnosis not present

## 2016-12-25 DIAGNOSIS — M6281 Muscle weakness (generalized): Secondary | ICD-10-CM | POA: Diagnosis not present

## 2016-12-28 IMAGING — DX DG ABDOMEN 1V
1 series · 1 of 1 positions shown · non-contrast
Comparison: CT abdomen and pelvis 02/03/2015.

CLINICAL DATA: Patient status post ventriculostomy shunt catheter
placement in the January 2015. Headache and swelling about the shunt.
Initial encounter.

EXAM:
ABDOMEN - 1 VIEW

[abdomen kub]
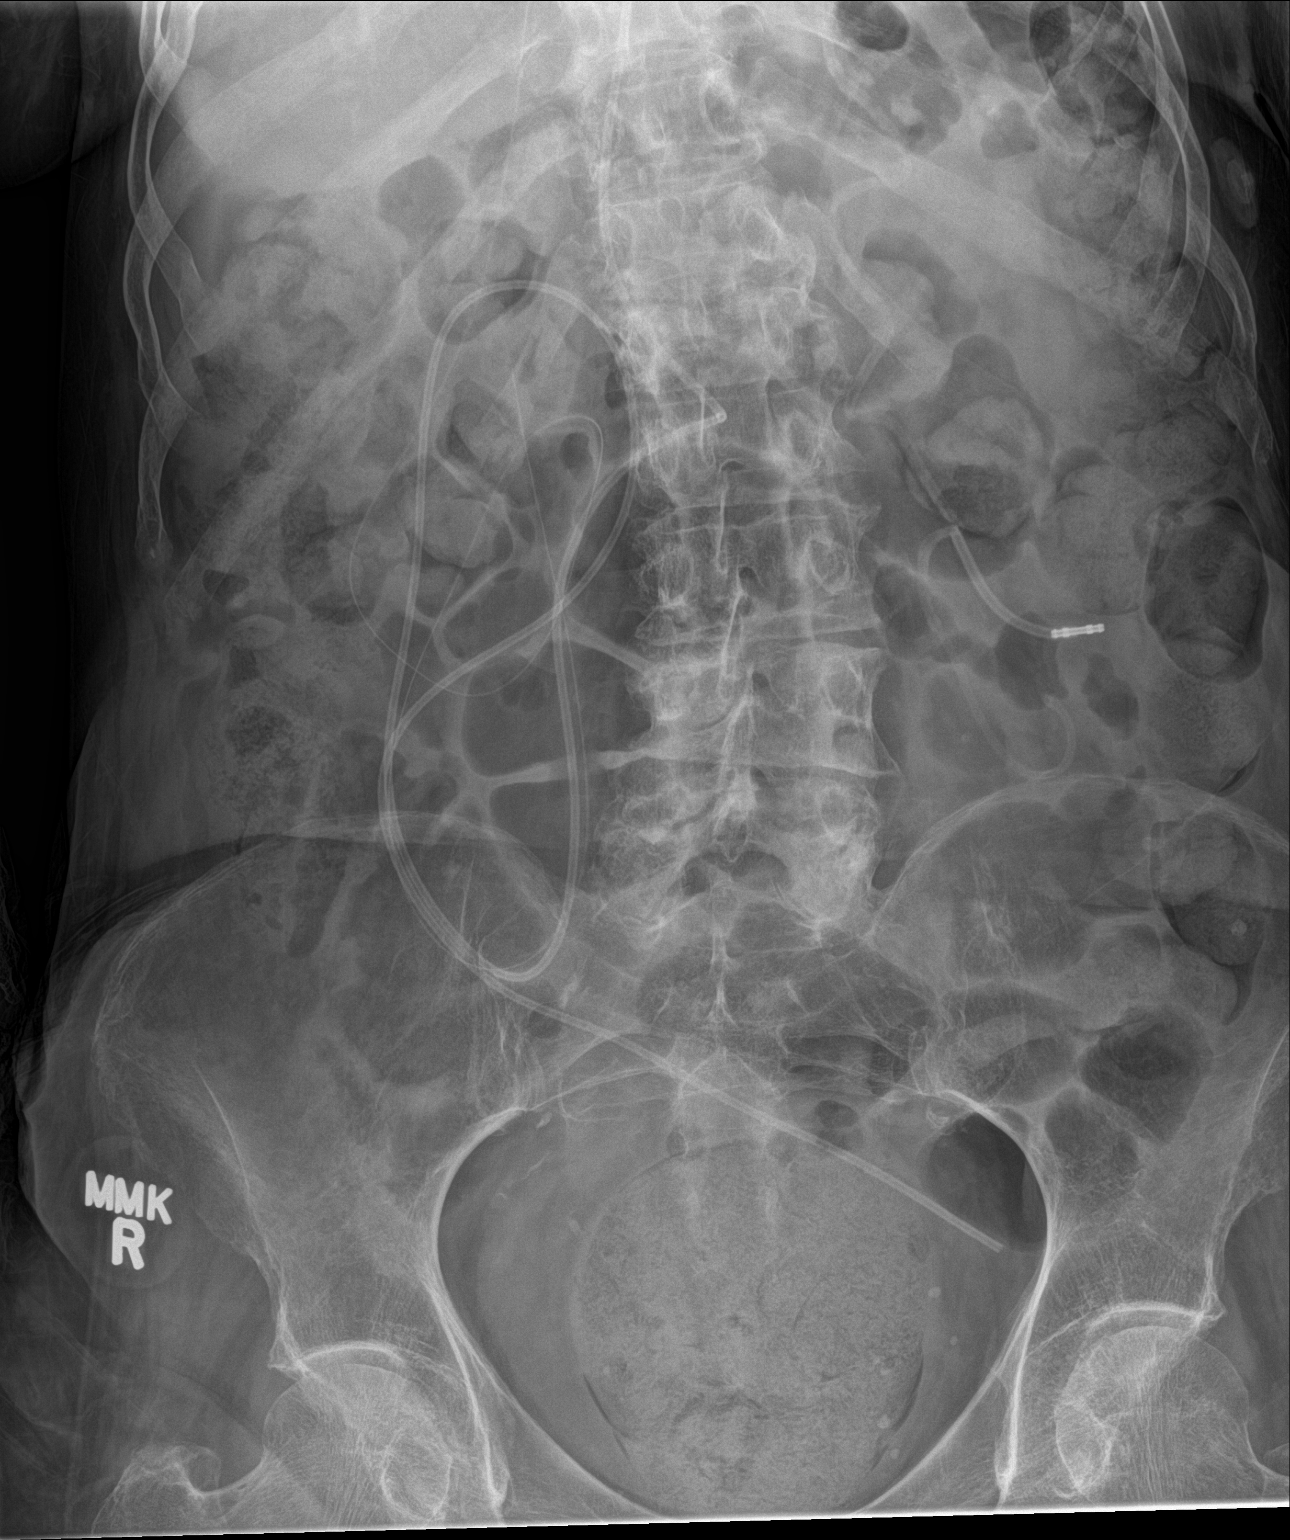

[1 of 1 positions shown; findings below may reference images not displayed]

FINDINGS: Three sets of tubing are identified projecting in the abdomen. All
image tubing appears intact. The bowel gas pattern is
nonobstructive. The patient has a very large volume of stool
throughout the colon with a massive stool ball present in the
rectum.
IMPRESSION: Three sets of tubing are identified in the abdomen consistent with
ventriculostomy shunt catheters. All imaged tubing appears intact.

Large volume of stool throughout the colon with a very large stool
ball in the rectum.

## 2017-04-14 DIAGNOSIS — R270 Ataxia, unspecified: Secondary | ICD-10-CM | POA: Diagnosis not present

## 2017-04-14 DIAGNOSIS — I69820 Aphasia following other cerebrovascular disease: Secondary | ICD-10-CM | POA: Diagnosis not present

## 2017-04-14 DIAGNOSIS — Z982 Presence of cerebrospinal fluid drainage device: Secondary | ICD-10-CM | POA: Diagnosis not present

## 2017-04-14 DIAGNOSIS — I10 Essential (primary) hypertension: Secondary | ICD-10-CM | POA: Diagnosis not present

## 2017-04-15 DIAGNOSIS — I10 Essential (primary) hypertension: Secondary | ICD-10-CM | POA: Diagnosis not present

## 2017-04-15 DIAGNOSIS — I509 Heart failure, unspecified: Secondary | ICD-10-CM | POA: Diagnosis not present

## 2017-04-15 DIAGNOSIS — D649 Anemia, unspecified: Secondary | ICD-10-CM | POA: Diagnosis not present

## 2017-04-15 DIAGNOSIS — E039 Hypothyroidism, unspecified: Secondary | ICD-10-CM | POA: Diagnosis not present

## 2017-06-08 DIAGNOSIS — R279 Unspecified lack of coordination: Secondary | ICD-10-CM | POA: Diagnosis not present

## 2017-06-08 DIAGNOSIS — M6281 Muscle weakness (generalized): Secondary | ICD-10-CM | POA: Diagnosis not present

## 2017-06-11 DIAGNOSIS — M6281 Muscle weakness (generalized): Secondary | ICD-10-CM | POA: Diagnosis not present

## 2017-06-11 DIAGNOSIS — R279 Unspecified lack of coordination: Secondary | ICD-10-CM | POA: Diagnosis not present

## 2017-06-12 DIAGNOSIS — R279 Unspecified lack of coordination: Secondary | ICD-10-CM | POA: Diagnosis not present

## 2017-06-13 DIAGNOSIS — R279 Unspecified lack of coordination: Secondary | ICD-10-CM | POA: Diagnosis not present

## 2017-06-18 ENCOUNTER — Encounter: Payer: Self-pay | Admitting: Cardiovascular Disease

## 2017-06-18 NOTE — Telephone Encounter (Signed)
[facility] has a Critical Lab Result::

## 2017-06-18 NOTE — Telephone Encounter (Signed)
This encounter was created in error - please disregard.

## 2017-06-19 DIAGNOSIS — I10 Essential (primary) hypertension: Secondary | ICD-10-CM | POA: Diagnosis not present

## 2017-06-19 DIAGNOSIS — M6281 Muscle weakness (generalized): Secondary | ICD-10-CM | POA: Diagnosis not present

## 2017-06-19 DIAGNOSIS — I639 Cerebral infarction, unspecified: Secondary | ICD-10-CM | POA: Diagnosis not present

## 2017-06-19 DIAGNOSIS — D649 Anemia, unspecified: Secondary | ICD-10-CM | POA: Diagnosis not present

## 2017-06-24 DIAGNOSIS — F329 Major depressive disorder, single episode, unspecified: Secondary | ICD-10-CM | POA: Diagnosis not present

## 2017-06-24 DIAGNOSIS — G40909 Epilepsy, unspecified, not intractable, without status epilepticus: Secondary | ICD-10-CM | POA: Diagnosis not present

## 2017-06-24 DIAGNOSIS — G919 Hydrocephalus, unspecified: Secondary | ICD-10-CM | POA: Diagnosis not present

## 2017-06-24 DIAGNOSIS — I10 Essential (primary) hypertension: Secondary | ICD-10-CM | POA: Diagnosis not present

## 2017-07-21 DIAGNOSIS — G919 Hydrocephalus, unspecified: Secondary | ICD-10-CM | POA: Diagnosis not present

## 2017-07-21 DIAGNOSIS — I69320 Aphasia following cerebral infarction: Secondary | ICD-10-CM | POA: Diagnosis not present

## 2017-07-21 DIAGNOSIS — M6281 Muscle weakness (generalized): Secondary | ICD-10-CM | POA: Diagnosis not present

## 2017-07-21 DIAGNOSIS — I10 Essential (primary) hypertension: Secondary | ICD-10-CM | POA: Diagnosis not present

## 2017-08-20 DIAGNOSIS — M6281 Muscle weakness (generalized): Secondary | ICD-10-CM | POA: Diagnosis not present

## 2017-08-20 DIAGNOSIS — I10 Essential (primary) hypertension: Secondary | ICD-10-CM | POA: Diagnosis not present

## 2017-08-20 DIAGNOSIS — R630 Anorexia: Secondary | ICD-10-CM | POA: Diagnosis not present

## 2017-08-20 DIAGNOSIS — I639 Cerebral infarction, unspecified: Secondary | ICD-10-CM | POA: Diagnosis not present

## 2017-09-07 DIAGNOSIS — R05 Cough: Secondary | ICD-10-CM | POA: Diagnosis not present

## 2017-09-24 DIAGNOSIS — Q845 Enlarged and hypertrophic nails: Secondary | ICD-10-CM | POA: Diagnosis not present

## 2017-09-24 DIAGNOSIS — K5900 Constipation, unspecified: Secondary | ICD-10-CM | POA: Diagnosis not present

## 2017-09-24 DIAGNOSIS — G919 Hydrocephalus, unspecified: Secondary | ICD-10-CM | POA: Diagnosis not present

## 2017-09-24 DIAGNOSIS — M6281 Muscle weakness (generalized): Secondary | ICD-10-CM | POA: Diagnosis not present

## 2017-10-14 DIAGNOSIS — W19XXXA Unspecified fall, initial encounter: Secondary | ICD-10-CM | POA: Diagnosis not present

## 2017-10-14 DIAGNOSIS — R296 Repeated falls: Secondary | ICD-10-CM | POA: Diagnosis not present

## 2017-10-26 DIAGNOSIS — Q845 Enlarged and hypertrophic nails: Secondary | ICD-10-CM | POA: Diagnosis not present

## 2017-10-26 DIAGNOSIS — K5900 Constipation, unspecified: Secondary | ICD-10-CM | POA: Diagnosis not present

## 2017-10-26 DIAGNOSIS — M6281 Muscle weakness (generalized): Secondary | ICD-10-CM | POA: Diagnosis not present

## 2017-10-26 DIAGNOSIS — G919 Hydrocephalus, unspecified: Secondary | ICD-10-CM | POA: Diagnosis not present

## 2017-10-27 DIAGNOSIS — D649 Anemia, unspecified: Secondary | ICD-10-CM | POA: Diagnosis not present

## 2017-10-27 DIAGNOSIS — I10 Essential (primary) hypertension: Secondary | ICD-10-CM | POA: Diagnosis not present

## 2017-10-28 DIAGNOSIS — I10 Essential (primary) hypertension: Secondary | ICD-10-CM | POA: Diagnosis not present

## 2017-10-28 DIAGNOSIS — R630 Anorexia: Secondary | ICD-10-CM | POA: Diagnosis not present

## 2017-10-28 DIAGNOSIS — I639 Cerebral infarction, unspecified: Secondary | ICD-10-CM | POA: Diagnosis not present

## 2017-10-28 DIAGNOSIS — M6281 Muscle weakness (generalized): Secondary | ICD-10-CM | POA: Diagnosis not present

## 2017-11-17 DIAGNOSIS — I739 Peripheral vascular disease, unspecified: Secondary | ICD-10-CM | POA: Diagnosis not present

## 2017-11-17 DIAGNOSIS — Q845 Enlarged and hypertrophic nails: Secondary | ICD-10-CM | POA: Diagnosis not present

## 2017-11-17 DIAGNOSIS — B351 Tinea unguium: Secondary | ICD-10-CM | POA: Diagnosis not present

## 2017-11-17 DIAGNOSIS — L603 Nail dystrophy: Secondary | ICD-10-CM | POA: Diagnosis not present

## 2017-11-27 DIAGNOSIS — K5900 Constipation, unspecified: Secondary | ICD-10-CM | POA: Diagnosis not present

## 2017-11-27 DIAGNOSIS — G919 Hydrocephalus, unspecified: Secondary | ICD-10-CM | POA: Diagnosis not present

## 2017-11-27 DIAGNOSIS — M6281 Muscle weakness (generalized): Secondary | ICD-10-CM | POA: Diagnosis not present

## 2017-11-27 DIAGNOSIS — F329 Major depressive disorder, single episode, unspecified: Secondary | ICD-10-CM | POA: Diagnosis not present

## 2017-12-30 DIAGNOSIS — G919 Hydrocephalus, unspecified: Secondary | ICD-10-CM | POA: Diagnosis not present

## 2017-12-30 DIAGNOSIS — I639 Cerebral infarction, unspecified: Secondary | ICD-10-CM | POA: Diagnosis not present

## 2017-12-30 DIAGNOSIS — I10 Essential (primary) hypertension: Secondary | ICD-10-CM | POA: Diagnosis not present

## 2017-12-30 DIAGNOSIS — M6281 Muscle weakness (generalized): Secondary | ICD-10-CM | POA: Diagnosis not present

## 2018-01-01 DIAGNOSIS — G919 Hydrocephalus, unspecified: Secondary | ICD-10-CM | POA: Diagnosis not present

## 2018-01-01 DIAGNOSIS — I10 Essential (primary) hypertension: Secondary | ICD-10-CM | POA: Diagnosis not present

## 2018-01-01 DIAGNOSIS — M6281 Muscle weakness (generalized): Secondary | ICD-10-CM | POA: Diagnosis not present

## 2018-01-01 DIAGNOSIS — K5900 Constipation, unspecified: Secondary | ICD-10-CM | POA: Diagnosis not present

## 2018-02-04 DIAGNOSIS — F329 Major depressive disorder, single episode, unspecified: Secondary | ICD-10-CM | POA: Diagnosis not present

## 2018-02-04 DIAGNOSIS — G919 Hydrocephalus, unspecified: Secondary | ICD-10-CM | POA: Diagnosis not present

## 2018-02-04 DIAGNOSIS — I639 Cerebral infarction, unspecified: Secondary | ICD-10-CM | POA: Diagnosis not present

## 2018-02-04 DIAGNOSIS — I10 Essential (primary) hypertension: Secondary | ICD-10-CM | POA: Diagnosis not present

## 2018-02-22 DIAGNOSIS — E785 Hyperlipidemia, unspecified: Secondary | ICD-10-CM | POA: Diagnosis not present

## 2018-02-22 DIAGNOSIS — I10 Essential (primary) hypertension: Secondary | ICD-10-CM | POA: Diagnosis not present

## 2018-02-22 DIAGNOSIS — D649 Anemia, unspecified: Secondary | ICD-10-CM | POA: Diagnosis not present

## 2018-03-09 DIAGNOSIS — K5900 Constipation, unspecified: Secondary | ICD-10-CM | POA: Diagnosis not present

## 2018-03-09 DIAGNOSIS — M6281 Muscle weakness (generalized): Secondary | ICD-10-CM | POA: Diagnosis not present

## 2018-03-09 DIAGNOSIS — F329 Major depressive disorder, single episode, unspecified: Secondary | ICD-10-CM | POA: Diagnosis not present

## 2018-03-09 DIAGNOSIS — I10 Essential (primary) hypertension: Secondary | ICD-10-CM | POA: Diagnosis not present

## 2018-04-14 DIAGNOSIS — M6281 Muscle weakness (generalized): Secondary | ICD-10-CM | POA: Diagnosis not present

## 2018-04-14 DIAGNOSIS — I10 Essential (primary) hypertension: Secondary | ICD-10-CM | POA: Diagnosis not present

## 2018-04-14 DIAGNOSIS — I639 Cerebral infarction, unspecified: Secondary | ICD-10-CM | POA: Diagnosis not present

## 2018-04-14 DIAGNOSIS — G919 Hydrocephalus, unspecified: Secondary | ICD-10-CM | POA: Diagnosis not present

## 2018-05-13 DIAGNOSIS — I10 Essential (primary) hypertension: Secondary | ICD-10-CM | POA: Diagnosis not present

## 2018-05-13 DIAGNOSIS — G919 Hydrocephalus, unspecified: Secondary | ICD-10-CM | POA: Diagnosis not present

## 2018-05-13 DIAGNOSIS — K5900 Constipation, unspecified: Secondary | ICD-10-CM | POA: Diagnosis not present

## 2018-05-13 DIAGNOSIS — M6281 Muscle weakness (generalized): Secondary | ICD-10-CM | POA: Diagnosis not present

## 2018-06-07 DIAGNOSIS — R296 Repeated falls: Secondary | ICD-10-CM | POA: Diagnosis not present

## 2018-06-11 DIAGNOSIS — K5900 Constipation, unspecified: Secondary | ICD-10-CM | POA: Diagnosis not present

## 2018-06-11 DIAGNOSIS — G919 Hydrocephalus, unspecified: Secondary | ICD-10-CM | POA: Diagnosis not present

## 2018-06-11 DIAGNOSIS — M6281 Muscle weakness (generalized): Secondary | ICD-10-CM | POA: Diagnosis not present

## 2018-06-11 DIAGNOSIS — I10 Essential (primary) hypertension: Secondary | ICD-10-CM | POA: Diagnosis not present

## 2018-07-21 DIAGNOSIS — M24573 Contracture, unspecified ankle: Secondary | ICD-10-CM | POA: Diagnosis not present

## 2018-07-21 DIAGNOSIS — Z982 Presence of cerebrospinal fluid drainage device: Secondary | ICD-10-CM | POA: Diagnosis not present

## 2018-07-21 DIAGNOSIS — M6281 Muscle weakness (generalized): Secondary | ICD-10-CM | POA: Diagnosis not present

## 2018-07-21 DIAGNOSIS — G40909 Epilepsy, unspecified, not intractable, without status epilepticus: Secondary | ICD-10-CM | POA: Diagnosis not present

## 2018-07-22 DIAGNOSIS — M6281 Muscle weakness (generalized): Secondary | ICD-10-CM | POA: Diagnosis not present

## 2018-07-22 DIAGNOSIS — Z982 Presence of cerebrospinal fluid drainage device: Secondary | ICD-10-CM | POA: Diagnosis not present

## 2018-07-22 DIAGNOSIS — M24573 Contracture, unspecified ankle: Secondary | ICD-10-CM | POA: Diagnosis not present

## 2018-07-22 DIAGNOSIS — G40909 Epilepsy, unspecified, not intractable, without status epilepticus: Secondary | ICD-10-CM | POA: Diagnosis not present

## 2018-07-23 DIAGNOSIS — Z982 Presence of cerebrospinal fluid drainage device: Secondary | ICD-10-CM | POA: Diagnosis not present

## 2018-07-23 DIAGNOSIS — G40909 Epilepsy, unspecified, not intractable, without status epilepticus: Secondary | ICD-10-CM | POA: Diagnosis not present

## 2018-07-23 DIAGNOSIS — M24573 Contracture, unspecified ankle: Secondary | ICD-10-CM | POA: Diagnosis not present

## 2018-07-23 DIAGNOSIS — M6281 Muscle weakness (generalized): Secondary | ICD-10-CM | POA: Diagnosis not present

## 2018-07-26 DIAGNOSIS — G40909 Epilepsy, unspecified, not intractable, without status epilepticus: Secondary | ICD-10-CM | POA: Diagnosis not present

## 2018-07-26 DIAGNOSIS — M6281 Muscle weakness (generalized): Secondary | ICD-10-CM | POA: Diagnosis not present

## 2018-07-26 DIAGNOSIS — M24573 Contracture, unspecified ankle: Secondary | ICD-10-CM | POA: Diagnosis not present

## 2018-07-26 DIAGNOSIS — Z982 Presence of cerebrospinal fluid drainage device: Secondary | ICD-10-CM | POA: Diagnosis not present

## 2018-07-27 DIAGNOSIS — M6281 Muscle weakness (generalized): Secondary | ICD-10-CM | POA: Diagnosis not present

## 2018-07-27 DIAGNOSIS — G40909 Epilepsy, unspecified, not intractable, without status epilepticus: Secondary | ICD-10-CM | POA: Diagnosis not present

## 2018-07-27 DIAGNOSIS — M24573 Contracture, unspecified ankle: Secondary | ICD-10-CM | POA: Diagnosis not present

## 2018-07-27 DIAGNOSIS — Z982 Presence of cerebrospinal fluid drainage device: Secondary | ICD-10-CM | POA: Diagnosis not present

## 2018-07-28 DIAGNOSIS — M6281 Muscle weakness (generalized): Secondary | ICD-10-CM | POA: Diagnosis not present

## 2018-07-28 DIAGNOSIS — G40909 Epilepsy, unspecified, not intractable, without status epilepticus: Secondary | ICD-10-CM | POA: Diagnosis not present

## 2018-07-28 DIAGNOSIS — M24573 Contracture, unspecified ankle: Secondary | ICD-10-CM | POA: Diagnosis not present

## 2018-07-28 DIAGNOSIS — Z982 Presence of cerebrospinal fluid drainage device: Secondary | ICD-10-CM | POA: Diagnosis not present

## 2018-07-29 DIAGNOSIS — M6281 Muscle weakness (generalized): Secondary | ICD-10-CM | POA: Diagnosis not present

## 2018-07-29 DIAGNOSIS — G40909 Epilepsy, unspecified, not intractable, without status epilepticus: Secondary | ICD-10-CM | POA: Diagnosis not present

## 2018-07-29 DIAGNOSIS — M24573 Contracture, unspecified ankle: Secondary | ICD-10-CM | POA: Diagnosis not present

## 2018-07-29 DIAGNOSIS — Z982 Presence of cerebrospinal fluid drainage device: Secondary | ICD-10-CM | POA: Diagnosis not present

## 2018-08-02 DIAGNOSIS — M24573 Contracture, unspecified ankle: Secondary | ICD-10-CM | POA: Diagnosis not present

## 2018-08-02 DIAGNOSIS — Z982 Presence of cerebrospinal fluid drainage device: Secondary | ICD-10-CM | POA: Diagnosis not present

## 2018-08-02 DIAGNOSIS — M6281 Muscle weakness (generalized): Secondary | ICD-10-CM | POA: Diagnosis not present

## 2018-08-02 DIAGNOSIS — G40909 Epilepsy, unspecified, not intractable, without status epilepticus: Secondary | ICD-10-CM | POA: Diagnosis not present

## 2018-08-03 DIAGNOSIS — M24573 Contracture, unspecified ankle: Secondary | ICD-10-CM | POA: Diagnosis not present

## 2018-08-03 DIAGNOSIS — Z982 Presence of cerebrospinal fluid drainage device: Secondary | ICD-10-CM | POA: Diagnosis not present

## 2018-08-03 DIAGNOSIS — G40909 Epilepsy, unspecified, not intractable, without status epilepticus: Secondary | ICD-10-CM | POA: Diagnosis not present

## 2018-08-03 DIAGNOSIS — M6281 Muscle weakness (generalized): Secondary | ICD-10-CM | POA: Diagnosis not present

## 2018-08-04 DIAGNOSIS — M6281 Muscle weakness (generalized): Secondary | ICD-10-CM | POA: Diagnosis not present

## 2018-08-04 DIAGNOSIS — Z982 Presence of cerebrospinal fluid drainage device: Secondary | ICD-10-CM | POA: Diagnosis not present

## 2018-08-04 DIAGNOSIS — G40909 Epilepsy, unspecified, not intractable, without status epilepticus: Secondary | ICD-10-CM | POA: Diagnosis not present

## 2018-08-04 DIAGNOSIS — M24573 Contracture, unspecified ankle: Secondary | ICD-10-CM | POA: Diagnosis not present

## 2018-08-05 DIAGNOSIS — G40909 Epilepsy, unspecified, not intractable, without status epilepticus: Secondary | ICD-10-CM | POA: Diagnosis not present

## 2018-08-05 DIAGNOSIS — M24573 Contracture, unspecified ankle: Secondary | ICD-10-CM | POA: Diagnosis not present

## 2018-08-05 DIAGNOSIS — M6281 Muscle weakness (generalized): Secondary | ICD-10-CM | POA: Diagnosis not present

## 2018-08-05 DIAGNOSIS — Z982 Presence of cerebrospinal fluid drainage device: Secondary | ICD-10-CM | POA: Diagnosis not present

## 2018-08-06 DIAGNOSIS — Z982 Presence of cerebrospinal fluid drainage device: Secondary | ICD-10-CM | POA: Diagnosis not present

## 2018-08-06 DIAGNOSIS — M24573 Contracture, unspecified ankle: Secondary | ICD-10-CM | POA: Diagnosis not present

## 2018-08-06 DIAGNOSIS — M6281 Muscle weakness (generalized): Secondary | ICD-10-CM | POA: Diagnosis not present

## 2018-08-06 DIAGNOSIS — G40909 Epilepsy, unspecified, not intractable, without status epilepticus: Secondary | ICD-10-CM | POA: Diagnosis not present

## 2018-08-09 DIAGNOSIS — Z982 Presence of cerebrospinal fluid drainage device: Secondary | ICD-10-CM | POA: Diagnosis not present

## 2018-08-09 DIAGNOSIS — M6281 Muscle weakness (generalized): Secondary | ICD-10-CM | POA: Diagnosis not present

## 2018-08-09 DIAGNOSIS — M24573 Contracture, unspecified ankle: Secondary | ICD-10-CM | POA: Diagnosis not present

## 2018-08-09 DIAGNOSIS — G40909 Epilepsy, unspecified, not intractable, without status epilepticus: Secondary | ICD-10-CM | POA: Diagnosis not present

## 2018-08-11 DIAGNOSIS — G40909 Epilepsy, unspecified, not intractable, without status epilepticus: Secondary | ICD-10-CM | POA: Diagnosis not present

## 2018-08-11 DIAGNOSIS — Z982 Presence of cerebrospinal fluid drainage device: Secondary | ICD-10-CM | POA: Diagnosis not present

## 2018-08-11 DIAGNOSIS — M24573 Contracture, unspecified ankle: Secondary | ICD-10-CM | POA: Diagnosis not present

## 2018-08-11 DIAGNOSIS — M6281 Muscle weakness (generalized): Secondary | ICD-10-CM | POA: Diagnosis not present

## 2018-08-13 DIAGNOSIS — G40909 Epilepsy, unspecified, not intractable, without status epilepticus: Secondary | ICD-10-CM | POA: Diagnosis not present

## 2018-08-13 DIAGNOSIS — Z982 Presence of cerebrospinal fluid drainage device: Secondary | ICD-10-CM | POA: Diagnosis not present

## 2018-08-13 DIAGNOSIS — M6281 Muscle weakness (generalized): Secondary | ICD-10-CM | POA: Diagnosis not present

## 2018-08-13 DIAGNOSIS — M24573 Contracture, unspecified ankle: Secondary | ICD-10-CM | POA: Diagnosis not present

## 2018-08-16 DIAGNOSIS — M24573 Contracture, unspecified ankle: Secondary | ICD-10-CM | POA: Diagnosis not present

## 2018-08-16 DIAGNOSIS — M6281 Muscle weakness (generalized): Secondary | ICD-10-CM | POA: Diagnosis not present

## 2018-08-16 DIAGNOSIS — Z982 Presence of cerebrospinal fluid drainage device: Secondary | ICD-10-CM | POA: Diagnosis not present

## 2018-08-18 DIAGNOSIS — R1310 Dysphagia, unspecified: Secondary | ICD-10-CM | POA: Diagnosis not present

## 2018-08-18 DIAGNOSIS — R32 Unspecified urinary incontinence: Secondary | ICD-10-CM | POA: Diagnosis not present

## 2018-08-18 DIAGNOSIS — G4089 Other seizures: Secondary | ICD-10-CM | POA: Diagnosis not present

## 2018-08-18 DIAGNOSIS — M62471 Contracture of muscle, right ankle and foot: Secondary | ICD-10-CM | POA: Diagnosis not present

## 2018-08-19 DIAGNOSIS — R7989 Other specified abnormal findings of blood chemistry: Secondary | ICD-10-CM | POA: Diagnosis not present

## 2018-08-19 DIAGNOSIS — D649 Anemia, unspecified: Secondary | ICD-10-CM | POA: Diagnosis not present

## 2018-08-19 DIAGNOSIS — E559 Vitamin D deficiency, unspecified: Secondary | ICD-10-CM | POA: Diagnosis not present

## 2018-08-19 DIAGNOSIS — I10 Essential (primary) hypertension: Secondary | ICD-10-CM | POA: Diagnosis not present

## 2018-08-24 DIAGNOSIS — M6281 Muscle weakness (generalized): Secondary | ICD-10-CM | POA: Diagnosis not present

## 2018-08-24 DIAGNOSIS — Z982 Presence of cerebrospinal fluid drainage device: Secondary | ICD-10-CM | POA: Diagnosis not present

## 2018-08-24 DIAGNOSIS — M24573 Contracture, unspecified ankle: Secondary | ICD-10-CM | POA: Diagnosis not present

## 2018-08-25 DIAGNOSIS — M6281 Muscle weakness (generalized): Secondary | ICD-10-CM | POA: Diagnosis not present

## 2018-08-25 DIAGNOSIS — M24573 Contracture, unspecified ankle: Secondary | ICD-10-CM | POA: Diagnosis not present

## 2018-08-25 DIAGNOSIS — Z982 Presence of cerebrospinal fluid drainage device: Secondary | ICD-10-CM | POA: Diagnosis not present

## 2018-08-26 DIAGNOSIS — M24573 Contracture, unspecified ankle: Secondary | ICD-10-CM | POA: Diagnosis not present

## 2018-08-26 DIAGNOSIS — M6281 Muscle weakness (generalized): Secondary | ICD-10-CM | POA: Diagnosis not present

## 2018-08-26 DIAGNOSIS — Z982 Presence of cerebrospinal fluid drainage device: Secondary | ICD-10-CM | POA: Diagnosis not present

## 2018-08-27 DIAGNOSIS — M24573 Contracture, unspecified ankle: Secondary | ICD-10-CM | POA: Diagnosis not present

## 2018-08-27 DIAGNOSIS — Z982 Presence of cerebrospinal fluid drainage device: Secondary | ICD-10-CM | POA: Diagnosis not present

## 2018-08-27 DIAGNOSIS — M6281 Muscle weakness (generalized): Secondary | ICD-10-CM | POA: Diagnosis not present

## 2018-08-30 DIAGNOSIS — M6281 Muscle weakness (generalized): Secondary | ICD-10-CM | POA: Diagnosis not present

## 2018-08-30 DIAGNOSIS — M24573 Contracture, unspecified ankle: Secondary | ICD-10-CM | POA: Diagnosis not present

## 2018-08-30 DIAGNOSIS — Z982 Presence of cerebrospinal fluid drainage device: Secondary | ICD-10-CM | POA: Diagnosis not present

## 2018-10-06 DIAGNOSIS — Z0000 Encounter for general adult medical examination without abnormal findings: Secondary | ICD-10-CM | POA: Diagnosis not present

## 2018-10-06 DIAGNOSIS — I10 Essential (primary) hypertension: Secondary | ICD-10-CM | POA: Diagnosis not present

## 2018-10-12 DIAGNOSIS — G919 Hydrocephalus, unspecified: Secondary | ICD-10-CM | POA: Diagnosis not present

## 2018-10-12 DIAGNOSIS — I69398 Other sequelae of cerebral infarction: Secondary | ICD-10-CM | POA: Diagnosis not present

## 2018-10-12 DIAGNOSIS — R54 Age-related physical debility: Secondary | ICD-10-CM | POA: Diagnosis not present

## 2018-10-12 DIAGNOSIS — R32 Unspecified urinary incontinence: Secondary | ICD-10-CM | POA: Diagnosis not present

## 2018-11-12 DIAGNOSIS — R54 Age-related physical debility: Secondary | ICD-10-CM | POA: Diagnosis not present

## 2018-11-12 DIAGNOSIS — F0280 Dementia in other diseases classified elsewhere without behavioral disturbance: Secondary | ICD-10-CM | POA: Diagnosis not present

## 2018-11-12 DIAGNOSIS — K5900 Constipation, unspecified: Secondary | ICD-10-CM | POA: Diagnosis not present

## 2018-11-12 DIAGNOSIS — M62461 Contracture of muscle, right lower leg: Secondary | ICD-10-CM | POA: Diagnosis not present

## 2018-12-15 DIAGNOSIS — M24573 Contracture, unspecified ankle: Secondary | ICD-10-CM | POA: Diagnosis not present

## 2018-12-15 DIAGNOSIS — I10 Essential (primary) hypertension: Secondary | ICD-10-CM | POA: Diagnosis not present

## 2018-12-15 DIAGNOSIS — R0981 Nasal congestion: Secondary | ICD-10-CM | POA: Diagnosis not present

## 2018-12-15 DIAGNOSIS — R279 Unspecified lack of coordination: Secondary | ICD-10-CM | POA: Diagnosis not present

## 2018-12-15 DIAGNOSIS — G40909 Epilepsy, unspecified, not intractable, without status epilepticus: Secondary | ICD-10-CM | POA: Diagnosis not present

## 2018-12-15 DIAGNOSIS — M6281 Muscle weakness (generalized): Secondary | ICD-10-CM | POA: Diagnosis not present

## 2018-12-15 DIAGNOSIS — R52 Pain, unspecified: Secondary | ICD-10-CM | POA: Diagnosis not present

## 2018-12-16 DIAGNOSIS — G40909 Epilepsy, unspecified, not intractable, without status epilepticus: Secondary | ICD-10-CM | POA: Diagnosis not present

## 2018-12-16 DIAGNOSIS — M6281 Muscle weakness (generalized): Secondary | ICD-10-CM | POA: Diagnosis not present

## 2018-12-16 DIAGNOSIS — I10 Essential (primary) hypertension: Secondary | ICD-10-CM | POA: Diagnosis not present

## 2018-12-16 DIAGNOSIS — M24573 Contracture, unspecified ankle: Secondary | ICD-10-CM | POA: Diagnosis not present

## 2018-12-16 DIAGNOSIS — R279 Unspecified lack of coordination: Secondary | ICD-10-CM | POA: Diagnosis not present

## 2018-12-17 DIAGNOSIS — M6281 Muscle weakness (generalized): Secondary | ICD-10-CM | POA: Diagnosis not present

## 2018-12-17 DIAGNOSIS — R279 Unspecified lack of coordination: Secondary | ICD-10-CM | POA: Diagnosis not present

## 2018-12-17 DIAGNOSIS — M24573 Contracture, unspecified ankle: Secondary | ICD-10-CM | POA: Diagnosis not present

## 2018-12-17 DIAGNOSIS — I10 Essential (primary) hypertension: Secondary | ICD-10-CM | POA: Diagnosis not present

## 2018-12-17 DIAGNOSIS — G40909 Epilepsy, unspecified, not intractable, without status epilepticus: Secondary | ICD-10-CM | POA: Diagnosis not present

## 2018-12-20 DIAGNOSIS — M6281 Muscle weakness (generalized): Secondary | ICD-10-CM | POA: Diagnosis not present

## 2018-12-20 DIAGNOSIS — M24573 Contracture, unspecified ankle: Secondary | ICD-10-CM | POA: Diagnosis not present

## 2018-12-20 DIAGNOSIS — I10 Essential (primary) hypertension: Secondary | ICD-10-CM | POA: Diagnosis not present

## 2018-12-20 DIAGNOSIS — G40909 Epilepsy, unspecified, not intractable, without status epilepticus: Secondary | ICD-10-CM | POA: Diagnosis not present

## 2018-12-20 DIAGNOSIS — R279 Unspecified lack of coordination: Secondary | ICD-10-CM | POA: Diagnosis not present

## 2018-12-21 DIAGNOSIS — I10 Essential (primary) hypertension: Secondary | ICD-10-CM | POA: Diagnosis not present

## 2018-12-21 DIAGNOSIS — G40909 Epilepsy, unspecified, not intractable, without status epilepticus: Secondary | ICD-10-CM | POA: Diagnosis not present

## 2018-12-21 DIAGNOSIS — R279 Unspecified lack of coordination: Secondary | ICD-10-CM | POA: Diagnosis not present

## 2018-12-21 DIAGNOSIS — M24573 Contracture, unspecified ankle: Secondary | ICD-10-CM | POA: Diagnosis not present

## 2018-12-21 DIAGNOSIS — M6281 Muscle weakness (generalized): Secondary | ICD-10-CM | POA: Diagnosis not present

## 2018-12-22 DIAGNOSIS — G40909 Epilepsy, unspecified, not intractable, without status epilepticus: Secondary | ICD-10-CM | POA: Diagnosis not present

## 2018-12-22 DIAGNOSIS — R279 Unspecified lack of coordination: Secondary | ICD-10-CM | POA: Diagnosis not present

## 2018-12-22 DIAGNOSIS — M24573 Contracture, unspecified ankle: Secondary | ICD-10-CM | POA: Diagnosis not present

## 2018-12-22 DIAGNOSIS — I10 Essential (primary) hypertension: Secondary | ICD-10-CM | POA: Diagnosis not present

## 2018-12-22 DIAGNOSIS — M6281 Muscle weakness (generalized): Secondary | ICD-10-CM | POA: Diagnosis not present

## 2018-12-23 DIAGNOSIS — I69398 Other sequelae of cerebral infarction: Secondary | ICD-10-CM | POA: Diagnosis not present

## 2018-12-23 DIAGNOSIS — R279 Unspecified lack of coordination: Secondary | ICD-10-CM | POA: Diagnosis not present

## 2018-12-23 DIAGNOSIS — I10 Essential (primary) hypertension: Secondary | ICD-10-CM | POA: Diagnosis not present

## 2018-12-23 DIAGNOSIS — G919 Hydrocephalus, unspecified: Secondary | ICD-10-CM | POA: Diagnosis not present

## 2018-12-23 DIAGNOSIS — M24573 Contracture, unspecified ankle: Secondary | ICD-10-CM | POA: Diagnosis not present

## 2018-12-23 DIAGNOSIS — M6281 Muscle weakness (generalized): Secondary | ICD-10-CM | POA: Diagnosis not present

## 2018-12-23 DIAGNOSIS — K5900 Constipation, unspecified: Secondary | ICD-10-CM | POA: Diagnosis not present

## 2018-12-23 DIAGNOSIS — G40909 Epilepsy, unspecified, not intractable, without status epilepticus: Secondary | ICD-10-CM | POA: Diagnosis not present

## 2018-12-23 DIAGNOSIS — R54 Age-related physical debility: Secondary | ICD-10-CM | POA: Diagnosis not present

## 2018-12-24 DIAGNOSIS — M6281 Muscle weakness (generalized): Secondary | ICD-10-CM | POA: Diagnosis not present

## 2018-12-24 DIAGNOSIS — G40909 Epilepsy, unspecified, not intractable, without status epilepticus: Secondary | ICD-10-CM | POA: Diagnosis not present

## 2018-12-24 DIAGNOSIS — R279 Unspecified lack of coordination: Secondary | ICD-10-CM | POA: Diagnosis not present

## 2018-12-24 DIAGNOSIS — M24573 Contracture, unspecified ankle: Secondary | ICD-10-CM | POA: Diagnosis not present

## 2018-12-24 DIAGNOSIS — I10 Essential (primary) hypertension: Secondary | ICD-10-CM | POA: Diagnosis not present

## 2018-12-27 DIAGNOSIS — M24573 Contracture, unspecified ankle: Secondary | ICD-10-CM | POA: Diagnosis not present

## 2018-12-27 DIAGNOSIS — I10 Essential (primary) hypertension: Secondary | ICD-10-CM | POA: Diagnosis not present

## 2018-12-27 DIAGNOSIS — G40909 Epilepsy, unspecified, not intractable, without status epilepticus: Secondary | ICD-10-CM | POA: Diagnosis not present

## 2018-12-27 DIAGNOSIS — R279 Unspecified lack of coordination: Secondary | ICD-10-CM | POA: Diagnosis not present

## 2018-12-27 DIAGNOSIS — M6281 Muscle weakness (generalized): Secondary | ICD-10-CM | POA: Diagnosis not present

## 2018-12-28 DIAGNOSIS — M6281 Muscle weakness (generalized): Secondary | ICD-10-CM | POA: Diagnosis not present

## 2018-12-28 DIAGNOSIS — G40909 Epilepsy, unspecified, not intractable, without status epilepticus: Secondary | ICD-10-CM | POA: Diagnosis not present

## 2018-12-28 DIAGNOSIS — M24573 Contracture, unspecified ankle: Secondary | ICD-10-CM | POA: Diagnosis not present

## 2018-12-28 DIAGNOSIS — I10 Essential (primary) hypertension: Secondary | ICD-10-CM | POA: Diagnosis not present

## 2018-12-28 DIAGNOSIS — R279 Unspecified lack of coordination: Secondary | ICD-10-CM | POA: Diagnosis not present

## 2018-12-29 DIAGNOSIS — G40909 Epilepsy, unspecified, not intractable, without status epilepticus: Secondary | ICD-10-CM | POA: Diagnosis not present

## 2018-12-29 DIAGNOSIS — E039 Hypothyroidism, unspecified: Secondary | ICD-10-CM | POA: Diagnosis not present

## 2018-12-29 DIAGNOSIS — R279 Unspecified lack of coordination: Secondary | ICD-10-CM | POA: Diagnosis not present

## 2018-12-29 DIAGNOSIS — E559 Vitamin D deficiency, unspecified: Secondary | ICD-10-CM | POA: Diagnosis not present

## 2018-12-29 DIAGNOSIS — M6281 Muscle weakness (generalized): Secondary | ICD-10-CM | POA: Diagnosis not present

## 2018-12-29 DIAGNOSIS — M24573 Contracture, unspecified ankle: Secondary | ICD-10-CM | POA: Diagnosis not present

## 2018-12-29 DIAGNOSIS — I10 Essential (primary) hypertension: Secondary | ICD-10-CM | POA: Diagnosis not present

## 2018-12-29 DIAGNOSIS — D649 Anemia, unspecified: Secondary | ICD-10-CM | POA: Diagnosis not present

## 2018-12-30 DIAGNOSIS — G40909 Epilepsy, unspecified, not intractable, without status epilepticus: Secondary | ICD-10-CM | POA: Diagnosis not present

## 2018-12-30 DIAGNOSIS — M24573 Contracture, unspecified ankle: Secondary | ICD-10-CM | POA: Diagnosis not present

## 2018-12-30 DIAGNOSIS — M6281 Muscle weakness (generalized): Secondary | ICD-10-CM | POA: Diagnosis not present

## 2018-12-30 DIAGNOSIS — R279 Unspecified lack of coordination: Secondary | ICD-10-CM | POA: Diagnosis not present

## 2018-12-30 DIAGNOSIS — I10 Essential (primary) hypertension: Secondary | ICD-10-CM | POA: Diagnosis not present

## 2018-12-31 DIAGNOSIS — R279 Unspecified lack of coordination: Secondary | ICD-10-CM | POA: Diagnosis not present

## 2018-12-31 DIAGNOSIS — M6281 Muscle weakness (generalized): Secondary | ICD-10-CM | POA: Diagnosis not present

## 2018-12-31 DIAGNOSIS — G40909 Epilepsy, unspecified, not intractable, without status epilepticus: Secondary | ICD-10-CM | POA: Diagnosis not present

## 2018-12-31 DIAGNOSIS — I10 Essential (primary) hypertension: Secondary | ICD-10-CM | POA: Diagnosis not present

## 2018-12-31 DIAGNOSIS — M24573 Contracture, unspecified ankle: Secondary | ICD-10-CM | POA: Diagnosis not present

## 2019-01-02 DIAGNOSIS — R279 Unspecified lack of coordination: Secondary | ICD-10-CM | POA: Diagnosis not present

## 2019-01-02 DIAGNOSIS — I10 Essential (primary) hypertension: Secondary | ICD-10-CM | POA: Diagnosis not present

## 2019-01-02 DIAGNOSIS — G40909 Epilepsy, unspecified, not intractable, without status epilepticus: Secondary | ICD-10-CM | POA: Diagnosis not present

## 2019-01-02 DIAGNOSIS — M6281 Muscle weakness (generalized): Secondary | ICD-10-CM | POA: Diagnosis not present

## 2019-01-02 DIAGNOSIS — M24573 Contracture, unspecified ankle: Secondary | ICD-10-CM | POA: Diagnosis not present

## 2019-01-03 DIAGNOSIS — I10 Essential (primary) hypertension: Secondary | ICD-10-CM | POA: Diagnosis not present

## 2019-01-03 DIAGNOSIS — G40909 Epilepsy, unspecified, not intractable, without status epilepticus: Secondary | ICD-10-CM | POA: Diagnosis not present

## 2019-01-03 DIAGNOSIS — M6281 Muscle weakness (generalized): Secondary | ICD-10-CM | POA: Diagnosis not present

## 2019-01-03 DIAGNOSIS — M24573 Contracture, unspecified ankle: Secondary | ICD-10-CM | POA: Diagnosis not present

## 2019-01-03 DIAGNOSIS — R279 Unspecified lack of coordination: Secondary | ICD-10-CM | POA: Diagnosis not present

## 2019-01-04 DIAGNOSIS — M6281 Muscle weakness (generalized): Secondary | ICD-10-CM | POA: Diagnosis not present

## 2019-01-04 DIAGNOSIS — M24573 Contracture, unspecified ankle: Secondary | ICD-10-CM | POA: Diagnosis not present

## 2019-01-04 DIAGNOSIS — R279 Unspecified lack of coordination: Secondary | ICD-10-CM | POA: Diagnosis not present

## 2019-01-04 DIAGNOSIS — G40909 Epilepsy, unspecified, not intractable, without status epilepticus: Secondary | ICD-10-CM | POA: Diagnosis not present

## 2019-01-04 DIAGNOSIS — I10 Essential (primary) hypertension: Secondary | ICD-10-CM | POA: Diagnosis not present

## 2019-01-05 DIAGNOSIS — G40909 Epilepsy, unspecified, not intractable, without status epilepticus: Secondary | ICD-10-CM | POA: Diagnosis not present

## 2019-01-05 DIAGNOSIS — I10 Essential (primary) hypertension: Secondary | ICD-10-CM | POA: Diagnosis not present

## 2019-01-05 DIAGNOSIS — R279 Unspecified lack of coordination: Secondary | ICD-10-CM | POA: Diagnosis not present

## 2019-01-05 DIAGNOSIS — M24573 Contracture, unspecified ankle: Secondary | ICD-10-CM | POA: Diagnosis not present

## 2019-01-05 DIAGNOSIS — M6281 Muscle weakness (generalized): Secondary | ICD-10-CM | POA: Diagnosis not present

## 2019-01-06 DIAGNOSIS — M24573 Contracture, unspecified ankle: Secondary | ICD-10-CM | POA: Diagnosis not present

## 2019-01-06 DIAGNOSIS — I10 Essential (primary) hypertension: Secondary | ICD-10-CM | POA: Diagnosis not present

## 2019-01-06 DIAGNOSIS — M6281 Muscle weakness (generalized): Secondary | ICD-10-CM | POA: Diagnosis not present

## 2019-01-06 DIAGNOSIS — G40909 Epilepsy, unspecified, not intractable, without status epilepticus: Secondary | ICD-10-CM | POA: Diagnosis not present

## 2019-01-06 DIAGNOSIS — R279 Unspecified lack of coordination: Secondary | ICD-10-CM | POA: Diagnosis not present

## 2019-01-10 DIAGNOSIS — I10 Essential (primary) hypertension: Secondary | ICD-10-CM | POA: Diagnosis not present

## 2019-01-10 DIAGNOSIS — R279 Unspecified lack of coordination: Secondary | ICD-10-CM | POA: Diagnosis not present

## 2019-01-10 DIAGNOSIS — G40909 Epilepsy, unspecified, not intractable, without status epilepticus: Secondary | ICD-10-CM | POA: Diagnosis not present

## 2019-01-10 DIAGNOSIS — M24573 Contracture, unspecified ankle: Secondary | ICD-10-CM | POA: Diagnosis not present

## 2019-01-10 DIAGNOSIS — M6281 Muscle weakness (generalized): Secondary | ICD-10-CM | POA: Diagnosis not present

## 2019-01-11 DIAGNOSIS — M6281 Muscle weakness (generalized): Secondary | ICD-10-CM | POA: Diagnosis not present

## 2019-01-11 DIAGNOSIS — I10 Essential (primary) hypertension: Secondary | ICD-10-CM | POA: Diagnosis not present

## 2019-01-11 DIAGNOSIS — G40909 Epilepsy, unspecified, not intractable, without status epilepticus: Secondary | ICD-10-CM | POA: Diagnosis not present

## 2019-01-11 DIAGNOSIS — R279 Unspecified lack of coordination: Secondary | ICD-10-CM | POA: Diagnosis not present

## 2019-01-11 DIAGNOSIS — M24573 Contracture, unspecified ankle: Secondary | ICD-10-CM | POA: Diagnosis not present

## 2019-01-12 DIAGNOSIS — M24573 Contracture, unspecified ankle: Secondary | ICD-10-CM | POA: Diagnosis not present

## 2019-01-12 DIAGNOSIS — M6281 Muscle weakness (generalized): Secondary | ICD-10-CM | POA: Diagnosis not present

## 2019-01-12 DIAGNOSIS — G40909 Epilepsy, unspecified, not intractable, without status epilepticus: Secondary | ICD-10-CM | POA: Diagnosis not present

## 2019-01-12 DIAGNOSIS — R279 Unspecified lack of coordination: Secondary | ICD-10-CM | POA: Diagnosis not present

## 2019-01-12 DIAGNOSIS — I10 Essential (primary) hypertension: Secondary | ICD-10-CM | POA: Diagnosis not present

## 2019-01-13 DIAGNOSIS — M24573 Contracture, unspecified ankle: Secondary | ICD-10-CM | POA: Diagnosis not present

## 2019-01-13 DIAGNOSIS — R279 Unspecified lack of coordination: Secondary | ICD-10-CM | POA: Diagnosis not present

## 2019-01-13 DIAGNOSIS — M6281 Muscle weakness (generalized): Secondary | ICD-10-CM | POA: Diagnosis not present

## 2019-01-13 DIAGNOSIS — I10 Essential (primary) hypertension: Secondary | ICD-10-CM | POA: Diagnosis not present

## 2019-01-13 DIAGNOSIS — G40909 Epilepsy, unspecified, not intractable, without status epilepticus: Secondary | ICD-10-CM | POA: Diagnosis not present

## 2019-01-17 DIAGNOSIS — I10 Essential (primary) hypertension: Secondary | ICD-10-CM | POA: Diagnosis not present

## 2019-01-17 DIAGNOSIS — G40909 Epilepsy, unspecified, not intractable, without status epilepticus: Secondary | ICD-10-CM | POA: Diagnosis not present

## 2019-01-17 DIAGNOSIS — M24573 Contracture, unspecified ankle: Secondary | ICD-10-CM | POA: Diagnosis not present

## 2019-01-17 DIAGNOSIS — R279 Unspecified lack of coordination: Secondary | ICD-10-CM | POA: Diagnosis not present

## 2019-01-17 DIAGNOSIS — M6281 Muscle weakness (generalized): Secondary | ICD-10-CM | POA: Diagnosis not present

## 2019-01-18 DIAGNOSIS — M6281 Muscle weakness (generalized): Secondary | ICD-10-CM | POA: Diagnosis not present

## 2019-01-18 DIAGNOSIS — G40909 Epilepsy, unspecified, not intractable, without status epilepticus: Secondary | ICD-10-CM | POA: Diagnosis not present

## 2019-01-18 DIAGNOSIS — I10 Essential (primary) hypertension: Secondary | ICD-10-CM | POA: Diagnosis not present

## 2019-01-18 DIAGNOSIS — M24573 Contracture, unspecified ankle: Secondary | ICD-10-CM | POA: Diagnosis not present

## 2019-01-18 DIAGNOSIS — R279 Unspecified lack of coordination: Secondary | ICD-10-CM | POA: Diagnosis not present

## 2019-01-19 DIAGNOSIS — M6281 Muscle weakness (generalized): Secondary | ICD-10-CM | POA: Diagnosis not present

## 2019-01-19 DIAGNOSIS — R279 Unspecified lack of coordination: Secondary | ICD-10-CM | POA: Diagnosis not present

## 2019-01-19 DIAGNOSIS — M24573 Contracture, unspecified ankle: Secondary | ICD-10-CM | POA: Diagnosis not present

## 2019-01-19 DIAGNOSIS — G40909 Epilepsy, unspecified, not intractable, without status epilepticus: Secondary | ICD-10-CM | POA: Diagnosis not present

## 2019-01-19 DIAGNOSIS — I10 Essential (primary) hypertension: Secondary | ICD-10-CM | POA: Diagnosis not present

## 2019-01-20 DIAGNOSIS — F0150 Vascular dementia without behavioral disturbance: Secondary | ICD-10-CM | POA: Diagnosis not present

## 2019-01-20 DIAGNOSIS — F339 Major depressive disorder, recurrent, unspecified: Secondary | ICD-10-CM | POA: Diagnosis not present

## 2019-01-24 DIAGNOSIS — M6281 Muscle weakness (generalized): Secondary | ICD-10-CM | POA: Diagnosis not present

## 2019-01-24 DIAGNOSIS — R279 Unspecified lack of coordination: Secondary | ICD-10-CM | POA: Diagnosis not present

## 2019-01-24 DIAGNOSIS — I10 Essential (primary) hypertension: Secondary | ICD-10-CM | POA: Diagnosis not present

## 2019-01-24 DIAGNOSIS — M24573 Contracture, unspecified ankle: Secondary | ICD-10-CM | POA: Diagnosis not present

## 2019-01-24 DIAGNOSIS — G40909 Epilepsy, unspecified, not intractable, without status epilepticus: Secondary | ICD-10-CM | POA: Diagnosis not present

## 2019-01-25 DIAGNOSIS — M6281 Muscle weakness (generalized): Secondary | ICD-10-CM | POA: Diagnosis not present

## 2019-01-25 DIAGNOSIS — I10 Essential (primary) hypertension: Secondary | ICD-10-CM | POA: Diagnosis not present

## 2019-01-25 DIAGNOSIS — M24573 Contracture, unspecified ankle: Secondary | ICD-10-CM | POA: Diagnosis not present

## 2019-01-25 DIAGNOSIS — G40909 Epilepsy, unspecified, not intractable, without status epilepticus: Secondary | ICD-10-CM | POA: Diagnosis not present

## 2019-01-25 DIAGNOSIS — R279 Unspecified lack of coordination: Secondary | ICD-10-CM | POA: Diagnosis not present

## 2019-01-26 DIAGNOSIS — I10 Essential (primary) hypertension: Secondary | ICD-10-CM | POA: Diagnosis not present

## 2019-01-26 DIAGNOSIS — M6281 Muscle weakness (generalized): Secondary | ICD-10-CM | POA: Diagnosis not present

## 2019-01-26 DIAGNOSIS — R279 Unspecified lack of coordination: Secondary | ICD-10-CM | POA: Diagnosis not present

## 2019-01-26 DIAGNOSIS — G40909 Epilepsy, unspecified, not intractable, without status epilepticus: Secondary | ICD-10-CM | POA: Diagnosis not present

## 2019-01-26 DIAGNOSIS — M24573 Contracture, unspecified ankle: Secondary | ICD-10-CM | POA: Diagnosis not present

## 2019-01-31 DIAGNOSIS — R54 Age-related physical debility: Secondary | ICD-10-CM | POA: Diagnosis not present

## 2019-01-31 DIAGNOSIS — I10 Essential (primary) hypertension: Secondary | ICD-10-CM | POA: Diagnosis not present

## 2019-01-31 DIAGNOSIS — E559 Vitamin D deficiency, unspecified: Secondary | ICD-10-CM | POA: Diagnosis not present

## 2019-01-31 DIAGNOSIS — G919 Hydrocephalus, unspecified: Secondary | ICD-10-CM | POA: Diagnosis not present

## 2019-04-08 ENCOUNTER — Other Ambulatory Visit: Payer: Self-pay

## 2019-04-08 ENCOUNTER — Ambulatory Visit: Payer: Medicare Other

## 2019-04-08 ENCOUNTER — Ambulatory Visit (INDEPENDENT_AMBULATORY_CARE_PROVIDER_SITE_OTHER): Payer: Medicare Other | Admitting: Podiatry

## 2019-04-08 ENCOUNTER — Encounter: Payer: Self-pay | Admitting: Podiatry

## 2019-04-08 DIAGNOSIS — L03039 Cellulitis of unspecified toe: Secondary | ICD-10-CM

## 2019-04-08 DIAGNOSIS — L03031 Cellulitis of right toe: Secondary | ICD-10-CM

## 2019-04-08 DIAGNOSIS — M79674 Pain in right toe(s): Secondary | ICD-10-CM

## 2019-04-08 NOTE — Patient Instructions (Signed)

## 2019-04-13 DIAGNOSIS — K5900 Constipation, unspecified: Secondary | ICD-10-CM | POA: Diagnosis not present

## 2019-04-13 DIAGNOSIS — K219 Gastro-esophageal reflux disease without esophagitis: Secondary | ICD-10-CM | POA: Diagnosis not present

## 2019-04-13 DIAGNOSIS — E559 Vitamin D deficiency, unspecified: Secondary | ICD-10-CM | POA: Diagnosis not present

## 2019-04-13 DIAGNOSIS — I10 Essential (primary) hypertension: Secondary | ICD-10-CM | POA: Diagnosis not present

## 2019-04-13 NOTE — Progress Notes (Signed)
Subjective:   Patient ID: Maureen Ortiz, female   DOB: 68 y.o.   MRN: [id number]   HPI 68 year old female presents the office today apparently for right toenail pain.  She only comes today with a driver from her facility and she is nonverbal.  Per the referral is for surgical removal of the right big toenail if needed.  She has been apparently complained the pain for some time.  Has noticed some redness around the area.  She is currently on antibiotics.  We did get verbal consent from her father for treatment today, Bunnie Philips [phone]   Review of Systems  All other systems reviewed and are negative.  Past Medical History:  Diagnosis Date  . Depression   . DVT of lower extremity (deep venous thrombosis) ([place])   . Falls frequently   . Hard of hearing   . Hydrocephalus ([place])   . Hypertension   . Incontinent of urine   . Seizures ([place])   . Vertigo     Past Surgical History:  Procedure Laterality Date  . BRAIN SURGERY     multiple  . BREAST BIOPSY     benign x 2  . COCHLEAR IMPLANT     left  . CSF SHUNT    . KNEE SURGERY     x 3  . LAPAROSCOPIC REVISION VENTRICULAR-PERITONEAL (V-P) SHUNT Right 02/09/2015   Procedure: LAPAROSCOPIC REVISION VENTRICULAR-PERITONEAL (V-P) SHUNT;  Surgeon: Eustace Moore, MD;  Location: [facility];  Service: Neurosurgery;  Laterality: Right;  . paraovarian cyst removal    . TUBAL LIGATION    . VENTRICULOPERITONEAL SHUNT Right 02/09/2015   Procedure: SHUNT INSERTION VENTRICULAR-PERITONEAL;  Surgeon: Eustace Moore, MD;  Location: [facility];  Service: Neurosurgery;  Laterality: Right;     Current Outpatient Medications:  .  buPROPion (WELLBUTRIN XL) 150 MG 24 hr tablet, Take 150 mg by mouth daily., Disp: , Rfl:  .  cholecalciferol (VITAMIN D) 1000 UNITS tablet, Take 1,000 Units by mouth daily., Disp: , Rfl:  .  ferrous sulfate 325 (65 FE) MG EC tablet, Take 325 mg by mouth daily.  , Disp: , Rfl:  .  FLUoxetine HCl 60 MG TABS, Take 1  tablet by mouth daily., Disp: , Rfl:  .  loratadine (CLARITIN) 10 MG tablet, Take 10 mg by mouth daily as needed for allergies., Disp: , Rfl:  .  LORazepam (ATIVAN) 1 MG tablet, Take 1 mg by mouth every 8 (eight) hours., Disp: , Rfl:  .  losartan (COZAAR) 50 MG tablet, Take 50 mg by mouth daily., Disp: , Rfl:  .  LUTEIN PO, Take 1 tablet by mouth daily. 18mg -0.4mg -221mcg, Disp: , Rfl:  .  Multiple Vitamin (MULTI-VITAMIN) tablet, Take by mouth., Disp: , Rfl:  .  Multiple Vitamins-Minerals (MULTIVITAMINS THER. W/MINERALS) TABS, Take 1 tablet by mouth daily.  , Disp: , Rfl:   Allergies  Allergen Reactions  . Codeine Nausea And Vomiting          Objective:  Physical Exam  General: Nonverbal however no apparent distress.  Unable to obtain a history.  Dermatological: On the right big toenail is mostly underlying nail bed.  There is dried blood as well as what appears to be a blister underneath the toenail.  Localized edema and erythema on the nail borders.  No ascending cellulitis.  Vascular: DP, PT pulses 1/4. CRT < 3 seconds.   Neruologic: Sensation decreased  Musculoskeletal: In a wheelchair     Assessment:  Infection right hallux toenail     Plan:  -Treatment options discussed including all alternatives, risks, and complications -There should be debrided the nail.  The nail is very loose and only attached on the very proximal medial nail border.  I did anesthetize the toe once I got back the base the nail for helping with pain control.  Anesthetized with 3 cc of lidocaine, Marcaine plain.  The nail was either removed or any complications.  No pus.  There was a blister any toenail but it caused the nail to loosen.  Granular wound present under the nailbed.  No laceration or exposed bone.  There is clearing with alcohol.  Antibiotic ointment and a bandage was applied.  Recommended continue daily dressing changes with antibiotic ointment and Epson salt soaks.  Continue course of  antibiotics.  Return in about 1 week (around 04/15/2019).  Vivi BarrackMatthew R Wil Slape DPM

## 2019-04-15 ENCOUNTER — Ambulatory Visit (INDEPENDENT_AMBULATORY_CARE_PROVIDER_SITE_OTHER): Payer: Medicare Other | Admitting: Podiatry

## 2019-04-15 ENCOUNTER — Encounter: Payer: Self-pay | Admitting: Podiatry

## 2019-04-15 ENCOUNTER — Other Ambulatory Visit: Payer: Self-pay

## 2019-04-15 DIAGNOSIS — L03031 Cellulitis of right toe: Secondary | ICD-10-CM | POA: Diagnosis not present

## 2019-04-15 DIAGNOSIS — L03039 Cellulitis of unspecified toe: Secondary | ICD-10-CM

## 2019-04-15 NOTE — Progress Notes (Signed)
Subjective: 68 year old female presents to the office for evaluation right hallux nail removal, infection.  She has expressive aphasia.  Not able to get a history.  The person who transports her is with her.  Denies any systemic complaints such as fevers, chills, nausea, vomiting. No acute changes since last appointment, and no other complaints at this time.   Objective: NAD DP/PT pulses palpable bilaterally, CRT less than 3 seconds Status post total nail avulsion of the right hallux.  Only small amount of granulation tissue present.  Overall appears to be healing well with any signs of infection.  There is no edema, erythema, increased warmth. No open lesions or pre-ulcerative lesions.  No pain with calf compression, swelling, warmth, erythema  Assessment: Status post total nail avulsion healing well  Plan: -All treatment options discussed with the patient including all alternatives, risks, complications.  -Areas almost completely healed.  I recommended to continue with antibiotic ointment dressing changes daily.  Not healed next 2 weeks for now or sooner if any issues are to arise.  Continue to monitor for any signs or symptoms of infection. -Patient encouraged to call the office with any questions, concerns, change in symptoms.   Trula Slade DPM

## 2019-05-17 DIAGNOSIS — Z1159 Encounter for screening for other viral diseases: Secondary | ICD-10-CM | POA: Diagnosis not present

## 2019-05-26 DIAGNOSIS — Z03818 Encounter for observation for suspected exposure to other biological agents ruled out: Secondary | ICD-10-CM | POA: Diagnosis not present

## 2019-05-30 DIAGNOSIS — R05 Cough: Secondary | ICD-10-CM | POA: Diagnosis not present

## 2019-05-30 DIAGNOSIS — Z9114 Patient's other noncompliance with medication regimen: Secondary | ICD-10-CM | POA: Diagnosis not present

## 2019-06-08 DIAGNOSIS — E559 Vitamin D deficiency, unspecified: Secondary | ICD-10-CM | POA: Diagnosis not present

## 2019-06-08 DIAGNOSIS — Z9114 Patient's other noncompliance with medication regimen: Secondary | ICD-10-CM | POA: Diagnosis not present

## 2019-06-08 DIAGNOSIS — K219 Gastro-esophageal reflux disease without esophagitis: Secondary | ICD-10-CM | POA: Diagnosis not present

## 2019-06-08 DIAGNOSIS — K5900 Constipation, unspecified: Secondary | ICD-10-CM | POA: Diagnosis not present

## 2019-06-24 DIAGNOSIS — M24573 Contracture, unspecified ankle: Secondary | ICD-10-CM | POA: Diagnosis not present

## 2019-06-24 DIAGNOSIS — M6281 Muscle weakness (generalized): Secondary | ICD-10-CM | POA: Diagnosis not present

## 2019-06-24 DIAGNOSIS — G8929 Other chronic pain: Secondary | ICD-10-CM | POA: Diagnosis not present

## 2019-06-24 DIAGNOSIS — Z9181 History of falling: Secondary | ICD-10-CM | POA: Diagnosis not present

## 2019-06-24 DIAGNOSIS — Z982 Presence of cerebrospinal fluid drainage device: Secondary | ICD-10-CM | POA: Diagnosis not present

## 2019-06-27 DIAGNOSIS — M6281 Muscle weakness (generalized): Secondary | ICD-10-CM | POA: Diagnosis not present

## 2019-06-27 DIAGNOSIS — Z982 Presence of cerebrospinal fluid drainage device: Secondary | ICD-10-CM | POA: Diagnosis not present

## 2019-06-27 DIAGNOSIS — Z9181 History of falling: Secondary | ICD-10-CM | POA: Diagnosis not present

## 2019-06-27 DIAGNOSIS — M24573 Contracture, unspecified ankle: Secondary | ICD-10-CM | POA: Diagnosis not present

## 2019-06-27 DIAGNOSIS — G8929 Other chronic pain: Secondary | ICD-10-CM | POA: Diagnosis not present

## 2019-06-28 DIAGNOSIS — Z1159 Encounter for screening for other viral diseases: Secondary | ICD-10-CM | POA: Diagnosis not present

## 2019-06-29 DIAGNOSIS — G8929 Other chronic pain: Secondary | ICD-10-CM | POA: Diagnosis not present

## 2019-06-29 DIAGNOSIS — Z9181 History of falling: Secondary | ICD-10-CM | POA: Diagnosis not present

## 2019-06-29 DIAGNOSIS — M6281 Muscle weakness (generalized): Secondary | ICD-10-CM | POA: Diagnosis not present

## 2019-06-29 DIAGNOSIS — Z982 Presence of cerebrospinal fluid drainage device: Secondary | ICD-10-CM | POA: Diagnosis not present

## 2019-06-29 DIAGNOSIS — M24573 Contracture, unspecified ankle: Secondary | ICD-10-CM | POA: Diagnosis not present

## 2019-06-30 DIAGNOSIS — G8929 Other chronic pain: Secondary | ICD-10-CM | POA: Diagnosis not present

## 2019-06-30 DIAGNOSIS — M6281 Muscle weakness (generalized): Secondary | ICD-10-CM | POA: Diagnosis not present

## 2019-06-30 DIAGNOSIS — Z9181 History of falling: Secondary | ICD-10-CM | POA: Diagnosis not present

## 2019-06-30 DIAGNOSIS — Z982 Presence of cerebrospinal fluid drainage device: Secondary | ICD-10-CM | POA: Diagnosis not present

## 2019-06-30 DIAGNOSIS — M24573 Contracture, unspecified ankle: Secondary | ICD-10-CM | POA: Diagnosis not present

## 2019-07-03 DIAGNOSIS — M24573 Contracture, unspecified ankle: Secondary | ICD-10-CM | POA: Diagnosis not present

## 2019-07-03 DIAGNOSIS — G8929 Other chronic pain: Secondary | ICD-10-CM | POA: Diagnosis not present

## 2019-07-03 DIAGNOSIS — Z9181 History of falling: Secondary | ICD-10-CM | POA: Diagnosis not present

## 2019-07-03 DIAGNOSIS — Z982 Presence of cerebrospinal fluid drainage device: Secondary | ICD-10-CM | POA: Diagnosis not present

## 2019-07-03 DIAGNOSIS — M6281 Muscle weakness (generalized): Secondary | ICD-10-CM | POA: Diagnosis not present

## 2019-07-04 DIAGNOSIS — M6281 Muscle weakness (generalized): Secondary | ICD-10-CM | POA: Diagnosis not present

## 2019-07-04 DIAGNOSIS — Z9181 History of falling: Secondary | ICD-10-CM | POA: Diagnosis not present

## 2019-07-04 DIAGNOSIS — M24573 Contracture, unspecified ankle: Secondary | ICD-10-CM | POA: Diagnosis not present

## 2019-07-04 DIAGNOSIS — Z982 Presence of cerebrospinal fluid drainage device: Secondary | ICD-10-CM | POA: Diagnosis not present

## 2019-07-04 DIAGNOSIS — G8929 Other chronic pain: Secondary | ICD-10-CM | POA: Diagnosis not present

## 2019-07-05 DIAGNOSIS — G8929 Other chronic pain: Secondary | ICD-10-CM | POA: Diagnosis not present

## 2019-07-05 DIAGNOSIS — Z982 Presence of cerebrospinal fluid drainage device: Secondary | ICD-10-CM | POA: Diagnosis not present

## 2019-07-05 DIAGNOSIS — M6281 Muscle weakness (generalized): Secondary | ICD-10-CM | POA: Diagnosis not present

## 2019-07-05 DIAGNOSIS — M24573 Contracture, unspecified ankle: Secondary | ICD-10-CM | POA: Diagnosis not present

## 2019-07-05 DIAGNOSIS — Z9181 History of falling: Secondary | ICD-10-CM | POA: Diagnosis not present

## 2019-07-06 DIAGNOSIS — M6281 Muscle weakness (generalized): Secondary | ICD-10-CM | POA: Diagnosis not present

## 2019-07-06 DIAGNOSIS — Z9181 History of falling: Secondary | ICD-10-CM | POA: Diagnosis not present

## 2019-07-06 DIAGNOSIS — Z982 Presence of cerebrospinal fluid drainage device: Secondary | ICD-10-CM | POA: Diagnosis not present

## 2019-07-06 DIAGNOSIS — M24573 Contracture, unspecified ankle: Secondary | ICD-10-CM | POA: Diagnosis not present

## 2019-07-06 DIAGNOSIS — G8929 Other chronic pain: Secondary | ICD-10-CM | POA: Diagnosis not present

## 2019-07-07 DIAGNOSIS — M24573 Contracture, unspecified ankle: Secondary | ICD-10-CM | POA: Diagnosis not present

## 2019-07-07 DIAGNOSIS — Z9181 History of falling: Secondary | ICD-10-CM | POA: Diagnosis not present

## 2019-07-07 DIAGNOSIS — M6281 Muscle weakness (generalized): Secondary | ICD-10-CM | POA: Diagnosis not present

## 2019-07-07 DIAGNOSIS — Z982 Presence of cerebrospinal fluid drainage device: Secondary | ICD-10-CM | POA: Diagnosis not present

## 2019-07-07 DIAGNOSIS — G8929 Other chronic pain: Secondary | ICD-10-CM | POA: Diagnosis not present

## 2019-07-08 DIAGNOSIS — Z982 Presence of cerebrospinal fluid drainage device: Secondary | ICD-10-CM | POA: Diagnosis not present

## 2019-07-08 DIAGNOSIS — Z9114 Patient's other noncompliance with medication regimen: Secondary | ICD-10-CM | POA: Diagnosis not present

## 2019-07-08 DIAGNOSIS — E559 Vitamin D deficiency, unspecified: Secondary | ICD-10-CM | POA: Diagnosis not present

## 2019-07-08 DIAGNOSIS — G8929 Other chronic pain: Secondary | ICD-10-CM | POA: Diagnosis not present

## 2019-07-08 DIAGNOSIS — K219 Gastro-esophageal reflux disease without esophagitis: Secondary | ICD-10-CM | POA: Diagnosis not present

## 2019-07-08 DIAGNOSIS — M6281 Muscle weakness (generalized): Secondary | ICD-10-CM | POA: Diagnosis not present

## 2019-07-08 DIAGNOSIS — M24573 Contracture, unspecified ankle: Secondary | ICD-10-CM | POA: Diagnosis not present

## 2019-07-08 DIAGNOSIS — K5900 Constipation, unspecified: Secondary | ICD-10-CM | POA: Diagnosis not present

## 2019-07-08 DIAGNOSIS — Z9181 History of falling: Secondary | ICD-10-CM | POA: Diagnosis not present

## 2019-07-11 DIAGNOSIS — M24573 Contracture, unspecified ankle: Secondary | ICD-10-CM | POA: Diagnosis not present

## 2019-07-11 DIAGNOSIS — Z982 Presence of cerebrospinal fluid drainage device: Secondary | ICD-10-CM | POA: Diagnosis not present

## 2019-07-11 DIAGNOSIS — Z9181 History of falling: Secondary | ICD-10-CM | POA: Diagnosis not present

## 2019-07-11 DIAGNOSIS — G8929 Other chronic pain: Secondary | ICD-10-CM | POA: Diagnosis not present

## 2019-07-11 DIAGNOSIS — M6281 Muscle weakness (generalized): Secondary | ICD-10-CM | POA: Diagnosis not present

## 2019-07-12 DIAGNOSIS — G8929 Other chronic pain: Secondary | ICD-10-CM | POA: Diagnosis not present

## 2019-07-12 DIAGNOSIS — Z0120 Encounter for dental examination and cleaning without abnormal findings: Secondary | ICD-10-CM | POA: Diagnosis not present

## 2019-07-12 DIAGNOSIS — Z9181 History of falling: Secondary | ICD-10-CM | POA: Diagnosis not present

## 2019-07-12 DIAGNOSIS — M24573 Contracture, unspecified ankle: Secondary | ICD-10-CM | POA: Diagnosis not present

## 2019-07-12 DIAGNOSIS — M6281 Muscle weakness (generalized): Secondary | ICD-10-CM | POA: Diagnosis not present

## 2019-07-12 DIAGNOSIS — Z982 Presence of cerebrospinal fluid drainage device: Secondary | ICD-10-CM | POA: Diagnosis not present

## 2019-07-13 DIAGNOSIS — Z9181 History of falling: Secondary | ICD-10-CM | POA: Diagnosis not present

## 2019-07-13 DIAGNOSIS — M6281 Muscle weakness (generalized): Secondary | ICD-10-CM | POA: Diagnosis not present

## 2019-07-13 DIAGNOSIS — G8929 Other chronic pain: Secondary | ICD-10-CM | POA: Diagnosis not present

## 2019-07-13 DIAGNOSIS — M24573 Contracture, unspecified ankle: Secondary | ICD-10-CM | POA: Diagnosis not present

## 2019-07-13 DIAGNOSIS — R05 Cough: Secondary | ICD-10-CM | POA: Diagnosis not present

## 2019-07-13 DIAGNOSIS — Z982 Presence of cerebrospinal fluid drainage device: Secondary | ICD-10-CM | POA: Diagnosis not present

## 2019-07-14 DIAGNOSIS — M24573 Contracture, unspecified ankle: Secondary | ICD-10-CM | POA: Diagnosis not present

## 2019-07-14 DIAGNOSIS — G8929 Other chronic pain: Secondary | ICD-10-CM | POA: Diagnosis not present

## 2019-07-14 DIAGNOSIS — Z982 Presence of cerebrospinal fluid drainage device: Secondary | ICD-10-CM | POA: Diagnosis not present

## 2019-07-14 DIAGNOSIS — M6281 Muscle weakness (generalized): Secondary | ICD-10-CM | POA: Diagnosis not present

## 2019-07-14 DIAGNOSIS — Z9181 History of falling: Secondary | ICD-10-CM | POA: Diagnosis not present

## 2019-07-15 DIAGNOSIS — G8929 Other chronic pain: Secondary | ICD-10-CM | POA: Diagnosis not present

## 2019-07-15 DIAGNOSIS — M24573 Contracture, unspecified ankle: Secondary | ICD-10-CM | POA: Diagnosis not present

## 2019-07-15 DIAGNOSIS — Z9181 History of falling: Secondary | ICD-10-CM | POA: Diagnosis not present

## 2019-07-15 DIAGNOSIS — Z982 Presence of cerebrospinal fluid drainage device: Secondary | ICD-10-CM | POA: Diagnosis not present

## 2019-07-15 DIAGNOSIS — M6281 Muscle weakness (generalized): Secondary | ICD-10-CM | POA: Diagnosis not present

## 2019-07-18 DIAGNOSIS — M24573 Contracture, unspecified ankle: Secondary | ICD-10-CM | POA: Diagnosis not present

## 2019-07-18 DIAGNOSIS — Z9181 History of falling: Secondary | ICD-10-CM | POA: Diagnosis not present

## 2019-07-18 DIAGNOSIS — Z982 Presence of cerebrospinal fluid drainage device: Secondary | ICD-10-CM | POA: Diagnosis not present

## 2019-07-18 DIAGNOSIS — M6281 Muscle weakness (generalized): Secondary | ICD-10-CM | POA: Diagnosis not present

## 2019-07-18 DIAGNOSIS — G8929 Other chronic pain: Secondary | ICD-10-CM | POA: Diagnosis not present

## 2019-07-19 DIAGNOSIS — Z03818 Encounter for observation for suspected exposure to other biological agents ruled out: Secondary | ICD-10-CM | POA: Diagnosis not present

## 2019-07-30 DIAGNOSIS — Z1159 Encounter for screening for other viral diseases: Secondary | ICD-10-CM | POA: Diagnosis not present

## 2019-08-07 DIAGNOSIS — Z03818 Encounter for observation for suspected exposure to other biological agents ruled out: Secondary | ICD-10-CM | POA: Diagnosis not present

## 2019-08-17 DIAGNOSIS — Z03818 Encounter for observation for suspected exposure to other biological agents ruled out: Secondary | ICD-10-CM | POA: Diagnosis not present

## 2019-08-18 DIAGNOSIS — I10 Essential (primary) hypertension: Secondary | ICD-10-CM | POA: Diagnosis not present

## 2019-08-18 DIAGNOSIS — D649 Anemia, unspecified: Secondary | ICD-10-CM | POA: Diagnosis not present

## 2019-08-25 DIAGNOSIS — H903 Sensorineural hearing loss, bilateral: Secondary | ICD-10-CM | POA: Diagnosis not present

## 2019-08-25 DIAGNOSIS — Z982 Presence of cerebrospinal fluid drainage device: Secondary | ICD-10-CM | POA: Diagnosis not present

## 2019-08-25 DIAGNOSIS — Z45321 Encounter for adjustment and management of cochlear device: Secondary | ICD-10-CM | POA: Diagnosis not present

## 2019-08-29 DIAGNOSIS — K5900 Constipation, unspecified: Secondary | ICD-10-CM | POA: Diagnosis not present

## 2019-08-29 DIAGNOSIS — K219 Gastro-esophageal reflux disease without esophagitis: Secondary | ICD-10-CM | POA: Diagnosis not present

## 2019-09-07 DIAGNOSIS — Z1159 Encounter for screening for other viral diseases: Secondary | ICD-10-CM | POA: Diagnosis not present

## 2019-09-12 DIAGNOSIS — Z03818 Encounter for observation for suspected exposure to other biological agents ruled out: Secondary | ICD-10-CM | POA: Diagnosis not present

## 2019-09-13 DIAGNOSIS — I10 Essential (primary) hypertension: Secondary | ICD-10-CM | POA: Diagnosis not present

## 2019-09-13 DIAGNOSIS — R54 Age-related physical debility: Secondary | ICD-10-CM | POA: Diagnosis not present

## 2019-09-13 DIAGNOSIS — G919 Hydrocephalus, unspecified: Secondary | ICD-10-CM | POA: Diagnosis not present

## 2019-09-13 DIAGNOSIS — H9193 Unspecified hearing loss, bilateral: Secondary | ICD-10-CM | POA: Diagnosis not present

## 2019-09-20 DIAGNOSIS — R638 Other symptoms and signs concerning food and fluid intake: Secondary | ICD-10-CM | POA: Diagnosis not present

## 2019-09-22 DIAGNOSIS — D649 Anemia, unspecified: Secondary | ICD-10-CM | POA: Diagnosis not present

## 2019-09-22 DIAGNOSIS — E569 Vitamin deficiency, unspecified: Secondary | ICD-10-CM | POA: Diagnosis not present

## 2019-09-22 DIAGNOSIS — I10 Essential (primary) hypertension: Secondary | ICD-10-CM | POA: Diagnosis not present

## 2019-09-23 DIAGNOSIS — U071 COVID-19: Secondary | ICD-10-CM | POA: Diagnosis not present

## 2019-09-23 DIAGNOSIS — E7800 Pure hypercholesterolemia, unspecified: Secondary | ICD-10-CM | POA: Diagnosis not present

## 2019-09-23 DIAGNOSIS — D649 Anemia, unspecified: Secondary | ICD-10-CM | POA: Diagnosis not present

## 2019-09-23 DIAGNOSIS — R05 Cough: Secondary | ICD-10-CM | POA: Diagnosis not present

## 2019-09-26 DIAGNOSIS — D649 Anemia, unspecified: Secondary | ICD-10-CM | POA: Diagnosis not present

## 2019-09-26 DIAGNOSIS — U071 COVID-19: Secondary | ICD-10-CM | POA: Diagnosis not present

## 2019-09-26 DIAGNOSIS — I10 Essential (primary) hypertension: Secondary | ICD-10-CM | POA: Diagnosis not present

## 2019-09-28 DIAGNOSIS — U071 COVID-19: Secondary | ICD-10-CM | POA: Diagnosis not present

## 2019-09-28 DIAGNOSIS — D649 Anemia, unspecified: Secondary | ICD-10-CM | POA: Diagnosis not present

## 2019-09-30 DIAGNOSIS — U071 COVID-19: Secondary | ICD-10-CM | POA: Diagnosis not present

## 2019-09-30 DIAGNOSIS — D649 Anemia, unspecified: Secondary | ICD-10-CM | POA: Diagnosis not present

## 2019-10-05 DIAGNOSIS — D649 Anemia, unspecified: Secondary | ICD-10-CM | POA: Diagnosis not present

## 2019-10-05 DIAGNOSIS — U071 COVID-19: Secondary | ICD-10-CM | POA: Diagnosis not present

## 2019-10-24 DIAGNOSIS — I10 Essential (primary) hypertension: Secondary | ICD-10-CM | POA: Diagnosis not present

## 2019-11-08 DIAGNOSIS — G919 Hydrocephalus, unspecified: Secondary | ICD-10-CM | POA: Diagnosis not present

## 2019-11-08 DIAGNOSIS — H9193 Unspecified hearing loss, bilateral: Secondary | ICD-10-CM | POA: Diagnosis not present

## 2019-11-08 DIAGNOSIS — R54 Age-related physical debility: Secondary | ICD-10-CM | POA: Diagnosis not present

## 2019-11-08 DIAGNOSIS — R634 Abnormal weight loss: Secondary | ICD-10-CM | POA: Diagnosis not present

## 2019-11-09 DIAGNOSIS — G40909 Epilepsy, unspecified, not intractable, without status epilepticus: Secondary | ICD-10-CM | POA: Diagnosis not present

## 2019-11-09 DIAGNOSIS — M6281 Muscle weakness (generalized): Secondary | ICD-10-CM | POA: Diagnosis not present

## 2019-11-09 DIAGNOSIS — I10 Essential (primary) hypertension: Secondary | ICD-10-CM | POA: Diagnosis not present

## 2019-11-09 DIAGNOSIS — Z982 Presence of cerebrospinal fluid drainage device: Secondary | ICD-10-CM | POA: Diagnosis not present

## 2019-11-09 DIAGNOSIS — R54 Age-related physical debility: Secondary | ICD-10-CM | POA: Diagnosis not present

## 2019-11-11 DIAGNOSIS — G40909 Epilepsy, unspecified, not intractable, without status epilepticus: Secondary | ICD-10-CM | POA: Diagnosis not present

## 2019-11-11 DIAGNOSIS — M6281 Muscle weakness (generalized): Secondary | ICD-10-CM | POA: Diagnosis not present

## 2019-11-11 DIAGNOSIS — Z982 Presence of cerebrospinal fluid drainage device: Secondary | ICD-10-CM | POA: Diagnosis not present

## 2019-11-14 DIAGNOSIS — H9193 Unspecified hearing loss, bilateral: Secondary | ICD-10-CM | POA: Diagnosis not present

## 2019-11-14 DIAGNOSIS — Z982 Presence of cerebrospinal fluid drainage device: Secondary | ICD-10-CM | POA: Diagnosis not present

## 2019-11-14 DIAGNOSIS — M24573 Contracture, unspecified ankle: Secondary | ICD-10-CM | POA: Diagnosis not present

## 2019-11-14 DIAGNOSIS — R54 Age-related physical debility: Secondary | ICD-10-CM | POA: Diagnosis not present

## 2019-11-14 DIAGNOSIS — M6281 Muscle weakness (generalized): Secondary | ICD-10-CM | POA: Diagnosis not present

## 2019-11-14 DIAGNOSIS — G8929 Other chronic pain: Secondary | ICD-10-CM | POA: Diagnosis not present

## 2019-11-14 DIAGNOSIS — G919 Hydrocephalus, unspecified: Secondary | ICD-10-CM | POA: Diagnosis not present

## 2019-11-14 DIAGNOSIS — Z9181 History of falling: Secondary | ICD-10-CM | POA: Diagnosis not present

## 2019-11-14 DIAGNOSIS — I10 Essential (primary) hypertension: Secondary | ICD-10-CM | POA: Diagnosis not present

## 2019-11-16 DIAGNOSIS — Z9181 History of falling: Secondary | ICD-10-CM | POA: Diagnosis not present

## 2019-11-16 DIAGNOSIS — G8929 Other chronic pain: Secondary | ICD-10-CM | POA: Diagnosis not present

## 2019-11-16 DIAGNOSIS — M6281 Muscle weakness (generalized): Secondary | ICD-10-CM | POA: Diagnosis not present

## 2019-11-16 DIAGNOSIS — Z982 Presence of cerebrospinal fluid drainage device: Secondary | ICD-10-CM | POA: Diagnosis not present

## 2019-11-16 DIAGNOSIS — M24573 Contracture, unspecified ankle: Secondary | ICD-10-CM | POA: Diagnosis not present

## 2019-11-18 DIAGNOSIS — M24573 Contracture, unspecified ankle: Secondary | ICD-10-CM | POA: Diagnosis not present

## 2019-11-18 DIAGNOSIS — Z9181 History of falling: Secondary | ICD-10-CM | POA: Diagnosis not present

## 2019-11-18 DIAGNOSIS — G8929 Other chronic pain: Secondary | ICD-10-CM | POA: Diagnosis not present

## 2019-11-18 DIAGNOSIS — Z982 Presence of cerebrospinal fluid drainage device: Secondary | ICD-10-CM | POA: Diagnosis not present

## 2019-11-18 DIAGNOSIS — M6281 Muscle weakness (generalized): Secondary | ICD-10-CM | POA: Diagnosis not present

## 2019-11-22 DIAGNOSIS — Z9181 History of falling: Secondary | ICD-10-CM | POA: Diagnosis not present

## 2019-11-22 DIAGNOSIS — M24573 Contracture, unspecified ankle: Secondary | ICD-10-CM | POA: Diagnosis not present

## 2019-11-22 DIAGNOSIS — M6281 Muscle weakness (generalized): Secondary | ICD-10-CM | POA: Diagnosis not present

## 2019-11-22 DIAGNOSIS — Z982 Presence of cerebrospinal fluid drainage device: Secondary | ICD-10-CM | POA: Diagnosis not present

## 2019-11-22 DIAGNOSIS — G8929 Other chronic pain: Secondary | ICD-10-CM | POA: Diagnosis not present

## 2019-11-25 DIAGNOSIS — G8929 Other chronic pain: Secondary | ICD-10-CM | POA: Diagnosis not present

## 2019-11-25 DIAGNOSIS — Z982 Presence of cerebrospinal fluid drainage device: Secondary | ICD-10-CM | POA: Diagnosis not present

## 2019-11-25 DIAGNOSIS — Z9181 History of falling: Secondary | ICD-10-CM | POA: Diagnosis not present

## 2019-11-25 DIAGNOSIS — M6281 Muscle weakness (generalized): Secondary | ICD-10-CM | POA: Diagnosis not present

## 2019-11-25 DIAGNOSIS — M24573 Contracture, unspecified ankle: Secondary | ICD-10-CM | POA: Diagnosis not present

## 2019-12-28 DIAGNOSIS — R54 Age-related physical debility: Secondary | ICD-10-CM | POA: Diagnosis not present

## 2019-12-28 DIAGNOSIS — I10 Essential (primary) hypertension: Secondary | ICD-10-CM | POA: Diagnosis not present

## 2019-12-28 DIAGNOSIS — H9193 Unspecified hearing loss, bilateral: Secondary | ICD-10-CM | POA: Diagnosis not present

## 2019-12-28 DIAGNOSIS — G919 Hydrocephalus, unspecified: Secondary | ICD-10-CM | POA: Diagnosis not present

## 2020-01-06 DIAGNOSIS — R54 Age-related physical debility: Secondary | ICD-10-CM | POA: Diagnosis not present

## 2020-01-06 DIAGNOSIS — K5900 Constipation, unspecified: Secondary | ICD-10-CM | POA: Diagnosis not present

## 2020-01-06 DIAGNOSIS — K219 Gastro-esophageal reflux disease without esophagitis: Secondary | ICD-10-CM | POA: Diagnosis not present

## 2020-01-06 DIAGNOSIS — E559 Vitamin D deficiency, unspecified: Secondary | ICD-10-CM | POA: Diagnosis not present

## 2020-01-19 DIAGNOSIS — G919 Hydrocephalus, unspecified: Secondary | ICD-10-CM | POA: Diagnosis not present

## 2020-01-19 DIAGNOSIS — Z982 Presence of cerebrospinal fluid drainage device: Secondary | ICD-10-CM | POA: Diagnosis not present

## 2020-01-19 DIAGNOSIS — M6281 Muscle weakness (generalized): Secondary | ICD-10-CM | POA: Diagnosis not present

## 2020-01-20 DIAGNOSIS — Z982 Presence of cerebrospinal fluid drainage device: Secondary | ICD-10-CM | POA: Diagnosis not present

## 2020-01-20 DIAGNOSIS — G919 Hydrocephalus, unspecified: Secondary | ICD-10-CM | POA: Diagnosis not present

## 2020-01-20 DIAGNOSIS — M6281 Muscle weakness (generalized): Secondary | ICD-10-CM | POA: Diagnosis not present

## 2020-01-24 DIAGNOSIS — Z982 Presence of cerebrospinal fluid drainage device: Secondary | ICD-10-CM | POA: Diagnosis not present

## 2020-01-24 DIAGNOSIS — G919 Hydrocephalus, unspecified: Secondary | ICD-10-CM | POA: Diagnosis not present

## 2020-01-24 DIAGNOSIS — M6281 Muscle weakness (generalized): Secondary | ICD-10-CM | POA: Diagnosis not present

## 2020-01-30 DIAGNOSIS — K219 Gastro-esophageal reflux disease without esophagitis: Secondary | ICD-10-CM | POA: Diagnosis not present

## 2020-01-30 DIAGNOSIS — K5900 Constipation, unspecified: Secondary | ICD-10-CM | POA: Diagnosis not present

## 2020-01-30 DIAGNOSIS — R54 Age-related physical debility: Secondary | ICD-10-CM | POA: Diagnosis not present

## 2020-01-30 DIAGNOSIS — E559 Vitamin D deficiency, unspecified: Secondary | ICD-10-CM | POA: Diagnosis not present

## 2020-02-27 DIAGNOSIS — E559 Vitamin D deficiency, unspecified: Secondary | ICD-10-CM | POA: Diagnosis not present

## 2020-02-27 DIAGNOSIS — R54 Age-related physical debility: Secondary | ICD-10-CM | POA: Diagnosis not present

## 2020-02-27 DIAGNOSIS — K5900 Constipation, unspecified: Secondary | ICD-10-CM | POA: Diagnosis not present

## 2020-02-27 DIAGNOSIS — K219 Gastro-esophageal reflux disease without esophagitis: Secondary | ICD-10-CM | POA: Diagnosis not present

## 2020-03-06 DIAGNOSIS — K219 Gastro-esophageal reflux disease without esophagitis: Secondary | ICD-10-CM | POA: Diagnosis not present

## 2020-03-06 DIAGNOSIS — I10 Essential (primary) hypertension: Secondary | ICD-10-CM | POA: Diagnosis not present

## 2020-03-06 DIAGNOSIS — R54 Age-related physical debility: Secondary | ICD-10-CM | POA: Diagnosis not present

## 2020-03-06 DIAGNOSIS — N39498 Other specified urinary incontinence: Secondary | ICD-10-CM | POA: Diagnosis not present

## 2020-03-14 DIAGNOSIS — R000 Tachycardia, unspecified: Secondary | ICD-10-CM | POA: Diagnosis not present

## 2020-03-26 DIAGNOSIS — G919 Hydrocephalus, unspecified: Secondary | ICD-10-CM | POA: Diagnosis not present

## 2020-03-26 DIAGNOSIS — K219 Gastro-esophageal reflux disease without esophagitis: Secondary | ICD-10-CM | POA: Diagnosis not present

## 2020-03-26 DIAGNOSIS — N39498 Other specified urinary incontinence: Secondary | ICD-10-CM | POA: Diagnosis not present

## 2020-03-26 DIAGNOSIS — I10 Essential (primary) hypertension: Secondary | ICD-10-CM | POA: Diagnosis not present

## 2020-03-27 DIAGNOSIS — I10 Essential (primary) hypertension: Secondary | ICD-10-CM | POA: Diagnosis not present

## 2020-03-27 DIAGNOSIS — E569 Vitamin deficiency, unspecified: Secondary | ICD-10-CM | POA: Diagnosis not present

## 2020-03-27 DIAGNOSIS — D649 Anemia, unspecified: Secondary | ICD-10-CM | POA: Diagnosis not present

## 2020-04-25 DIAGNOSIS — G919 Hydrocephalus, unspecified: Secondary | ICD-10-CM | POA: Diagnosis not present

## 2020-04-25 DIAGNOSIS — N39498 Other specified urinary incontinence: Secondary | ICD-10-CM | POA: Diagnosis not present

## 2020-04-25 DIAGNOSIS — K219 Gastro-esophageal reflux disease without esophagitis: Secondary | ICD-10-CM | POA: Diagnosis not present

## 2020-04-25 DIAGNOSIS — I10 Essential (primary) hypertension: Secondary | ICD-10-CM | POA: Diagnosis not present

## 2020-05-18 DIAGNOSIS — N39498 Other specified urinary incontinence: Secondary | ICD-10-CM | POA: Diagnosis not present

## 2020-05-18 DIAGNOSIS — I10 Essential (primary) hypertension: Secondary | ICD-10-CM | POA: Diagnosis not present

## 2020-05-18 DIAGNOSIS — K219 Gastro-esophageal reflux disease without esophagitis: Secondary | ICD-10-CM | POA: Diagnosis not present

## 2020-05-18 DIAGNOSIS — G919 Hydrocephalus, unspecified: Secondary | ICD-10-CM | POA: Diagnosis not present

## 2020-05-31 DIAGNOSIS — K219 Gastro-esophageal reflux disease without esophagitis: Secondary | ICD-10-CM | POA: Diagnosis not present

## 2020-05-31 DIAGNOSIS — I10 Essential (primary) hypertension: Secondary | ICD-10-CM | POA: Diagnosis not present

## 2020-05-31 DIAGNOSIS — G919 Hydrocephalus, unspecified: Secondary | ICD-10-CM | POA: Diagnosis not present

## 2020-05-31 DIAGNOSIS — N39498 Other specified urinary incontinence: Secondary | ICD-10-CM | POA: Diagnosis not present

## 2020-07-02 DIAGNOSIS — K5900 Constipation, unspecified: Secondary | ICD-10-CM | POA: Diagnosis not present

## 2020-07-02 DIAGNOSIS — N39498 Other specified urinary incontinence: Secondary | ICD-10-CM | POA: Diagnosis not present

## 2020-07-02 DIAGNOSIS — K219 Gastro-esophageal reflux disease without esophagitis: Secondary | ICD-10-CM | POA: Diagnosis not present

## 2020-07-02 DIAGNOSIS — I10 Essential (primary) hypertension: Secondary | ICD-10-CM | POA: Diagnosis not present

## 2020-12-28 DIAGNOSIS — F331 Major depressive disorder, recurrent, moderate: Secondary | ICD-10-CM | POA: Diagnosis not present

## 2020-12-28 DIAGNOSIS — R5383 Other fatigue: Secondary | ICD-10-CM | POA: Diagnosis not present

## 2021-02-20 DIAGNOSIS — E569 Vitamin deficiency, unspecified: Secondary | ICD-10-CM | POA: Diagnosis not present

## 2021-03-19 DIAGNOSIS — E569 Vitamin deficiency, unspecified: Secondary | ICD-10-CM | POA: Diagnosis not present

## 2021-03-19 DIAGNOSIS — R7989 Other specified abnormal findings of blood chemistry: Secondary | ICD-10-CM | POA: Diagnosis not present

## 2021-03-19 DIAGNOSIS — I10 Essential (primary) hypertension: Secondary | ICD-10-CM | POA: Diagnosis not present

## 2021-03-19 DIAGNOSIS — I509 Heart failure, unspecified: Secondary | ICD-10-CM | POA: Diagnosis not present

## 2021-04-23 DIAGNOSIS — E569 Vitamin deficiency, unspecified: Secondary | ICD-10-CM | POA: Diagnosis not present

## 2021-04-23 DIAGNOSIS — I509 Heart failure, unspecified: Secondary | ICD-10-CM | POA: Diagnosis not present

## 2021-07-16 DIAGNOSIS — F3341 Major depressive disorder, recurrent, in partial remission: Secondary | ICD-10-CM | POA: Diagnosis not present

## 2021-07-16 DIAGNOSIS — Z982 Presence of cerebrospinal fluid drainage device: Secondary | ICD-10-CM | POA: Diagnosis not present

## 2021-07-16 DIAGNOSIS — L219 Seborrheic dermatitis, unspecified: Secondary | ICD-10-CM | POA: Diagnosis not present

## 2021-07-16 DIAGNOSIS — G919 Hydrocephalus, unspecified: Secondary | ICD-10-CM | POA: Diagnosis not present

## 2021-07-16 DIAGNOSIS — G40909 Epilepsy, unspecified, not intractable, without status epilepticus: Secondary | ICD-10-CM | POA: Diagnosis not present

## 2021-07-16 DIAGNOSIS — I10 Essential (primary) hypertension: Secondary | ICD-10-CM | POA: Diagnosis not present

## 2021-07-22 DIAGNOSIS — E569 Vitamin deficiency, unspecified: Secondary | ICD-10-CM | POA: Diagnosis not present

## 2021-07-22 DIAGNOSIS — E119 Type 2 diabetes mellitus without complications: Secondary | ICD-10-CM | POA: Diagnosis not present

## 2021-07-22 DIAGNOSIS — I509 Heart failure, unspecified: Secondary | ICD-10-CM | POA: Diagnosis not present

## 2021-08-12 DIAGNOSIS — R112 Nausea with vomiting, unspecified: Secondary | ICD-10-CM | POA: Diagnosis not present

## 2021-08-12 DIAGNOSIS — G919 Hydrocephalus, unspecified: Secondary | ICD-10-CM | POA: Diagnosis not present

## 2021-08-12 DIAGNOSIS — R109 Unspecified abdominal pain: Secondary | ICD-10-CM | POA: Diagnosis not present

## 2021-08-12 DIAGNOSIS — R197 Diarrhea, unspecified: Secondary | ICD-10-CM | POA: Diagnosis not present

## 2021-08-12 DIAGNOSIS — R10817 Generalized abdominal tenderness: Secondary | ICD-10-CM | POA: Diagnosis not present

## 2021-08-13 DIAGNOSIS — R410 Disorientation, unspecified: Secondary | ICD-10-CM | POA: Diagnosis not present

## 2021-08-13 DIAGNOSIS — Z7689 Persons encountering health services in other specified circumstances: Secondary | ICD-10-CM | POA: Diagnosis not present

## 2021-08-13 DIAGNOSIS — R41841 Cognitive communication deficit: Secondary | ICD-10-CM | POA: Diagnosis not present

## 2021-08-13 DIAGNOSIS — R748 Abnormal levels of other serum enzymes: Secondary | ICD-10-CM | POA: Diagnosis not present

## 2021-11-26 DIAGNOSIS — E559 Vitamin D deficiency, unspecified: Secondary | ICD-10-CM | POA: Diagnosis not present

## 2022-02-05 DIAGNOSIS — G40909 Epilepsy, unspecified, not intractable, without status epilepticus: Secondary | ICD-10-CM | POA: Diagnosis not present

## 2022-02-05 DIAGNOSIS — R7989 Other specified abnormal findings of blood chemistry: Secondary | ICD-10-CM | POA: Diagnosis not present

## 2022-03-20 DIAGNOSIS — F331 Major depressive disorder, recurrent, moderate: Secondary | ICD-10-CM | POA: Diagnosis not present

## 2022-03-20 DIAGNOSIS — I10 Essential (primary) hypertension: Secondary | ICD-10-CM | POA: Diagnosis not present

## 2022-03-20 DIAGNOSIS — K219 Gastro-esophageal reflux disease without esophagitis: Secondary | ICD-10-CM | POA: Diagnosis not present

## 2022-03-20 DIAGNOSIS — K5900 Constipation, unspecified: Secondary | ICD-10-CM | POA: Diagnosis not present

## 2022-03-20 DIAGNOSIS — Z6822 Body mass index (BMI) 22.0-22.9, adult: Secondary | ICD-10-CM | POA: Diagnosis not present

## 2022-03-20 DIAGNOSIS — Z87440 Personal history of urinary (tract) infections: Secondary | ICD-10-CM | POA: Diagnosis not present

## 2022-03-20 DIAGNOSIS — G919 Hydrocephalus, unspecified: Secondary | ICD-10-CM | POA: Diagnosis not present

## 2022-03-27 DIAGNOSIS — R52 Pain, unspecified: Secondary | ICD-10-CM | POA: Diagnosis not present

## 2022-03-27 DIAGNOSIS — Z87440 Personal history of urinary (tract) infections: Secondary | ICD-10-CM | POA: Diagnosis not present

## 2022-03-27 DIAGNOSIS — R5383 Other fatigue: Secondary | ICD-10-CM | POA: Diagnosis not present

## 2022-03-27 DIAGNOSIS — N390 Urinary tract infection, site not specified: Secondary | ICD-10-CM | POA: Diagnosis not present

## 2022-03-27 DIAGNOSIS — R451 Restlessness and agitation: Secondary | ICD-10-CM | POA: Diagnosis not present

## 2022-03-31 DIAGNOSIS — Z7689 Persons encountering health services in other specified circumstances: Secondary | ICD-10-CM | POA: Diagnosis not present

## 2022-03-31 DIAGNOSIS — K5900 Constipation, unspecified: Secondary | ICD-10-CM | POA: Diagnosis not present

## 2022-03-31 DIAGNOSIS — R1013 Epigastric pain: Secondary | ICD-10-CM | POA: Diagnosis not present

## 2022-04-02 DIAGNOSIS — I509 Heart failure, unspecified: Secondary | ICD-10-CM | POA: Diagnosis not present

## 2022-04-02 DIAGNOSIS — R7989 Other specified abnormal findings of blood chemistry: Secondary | ICD-10-CM | POA: Diagnosis not present

## 2022-04-07 DIAGNOSIS — N181 Chronic kidney disease, stage 1: Secondary | ICD-10-CM | POA: Diagnosis not present

## 2022-04-07 DIAGNOSIS — S71111A Laceration without foreign body, right thigh, initial encounter: Secondary | ICD-10-CM | POA: Diagnosis not present

## 2022-04-16 DIAGNOSIS — S72451A Displaced supracondylar fracture without intracondylar extension of lower end of right femur, initial encounter for closed fracture: Secondary | ICD-10-CM | POA: Diagnosis not present

## 2022-04-16 DIAGNOSIS — R600 Localized edema: Secondary | ICD-10-CM | POA: Diagnosis not present

## 2022-04-16 DIAGNOSIS — M25561 Pain in right knee: Secondary | ICD-10-CM | POA: Diagnosis not present

## 2022-04-17 ENCOUNTER — Emergency Department (HOSPITAL_COMMUNITY): Payer: Medicare Other

## 2022-04-17 ENCOUNTER — Encounter (HOSPITAL_COMMUNITY): Payer: Self-pay | Admitting: Emergency Medicine

## 2022-04-17 ENCOUNTER — Other Ambulatory Visit: Payer: Self-pay

## 2022-04-17 ENCOUNTER — Observation Stay (HOSPITAL_COMMUNITY)
Admission: EM | Admit: 2022-04-17 | Discharge: 2022-04-18 | Disposition: A | Discharge status: Skilled Nursing Facility | Payer: Medicare Other | Attending: Internal Medicine | Admitting: Internal Medicine

## 2022-04-17 DIAGNOSIS — I10 Essential (primary) hypertension: Secondary | ICD-10-CM | POA: Diagnosis not present

## 2022-04-17 DIAGNOSIS — S72491A Other fracture of lower end of right femur, initial encounter for closed fracture: Principal | ICD-10-CM | POA: Insufficient documentation

## 2022-04-17 DIAGNOSIS — Z9621 Cochlear implant status: Secondary | ICD-10-CM | POA: Insufficient documentation

## 2022-04-17 DIAGNOSIS — S7291XA Unspecified fracture of right femur, initial encounter for closed fracture: Secondary | ICD-10-CM

## 2022-04-17 DIAGNOSIS — Z86718 Personal history of other venous thrombosis and embolism: Secondary | ICD-10-CM | POA: Insufficient documentation

## 2022-04-17 DIAGNOSIS — Z79899 Other long term (current) drug therapy: Secondary | ICD-10-CM | POA: Insufficient documentation

## 2022-04-17 DIAGNOSIS — Z982 Presence of cerebrospinal fluid drainage device: Secondary | ICD-10-CM | POA: Diagnosis not present

## 2022-04-17 DIAGNOSIS — S2231XA Fracture of one rib, right side, initial encounter for closed fracture: Secondary | ICD-10-CM | POA: Diagnosis not present

## 2022-04-17 DIAGNOSIS — J9601 Acute respiratory failure with hypoxia: Secondary | ICD-10-CM | POA: Diagnosis not present

## 2022-04-17 DIAGNOSIS — M25551 Pain in right hip: Secondary | ICD-10-CM | POA: Diagnosis not present

## 2022-04-17 DIAGNOSIS — R600 Localized edema: Secondary | ICD-10-CM | POA: Diagnosis not present

## 2022-04-17 DIAGNOSIS — S72401A Unspecified fracture of lower end of right femur, initial encounter for closed fracture: Secondary | ICD-10-CM

## 2022-04-17 DIAGNOSIS — S79929A Unspecified injury of unspecified thigh, initial encounter: Secondary | ICD-10-CM | POA: Diagnosis not present

## 2022-04-17 DIAGNOSIS — Z66 Do not resuscitate: Secondary | ICD-10-CM | POA: Insufficient documentation

## 2022-04-17 DIAGNOSIS — G919 Hydrocephalus, unspecified: Secondary | ICD-10-CM

## 2022-04-17 DIAGNOSIS — Z7401 Bed confinement status: Secondary | ICD-10-CM

## 2022-04-17 DIAGNOSIS — K449 Diaphragmatic hernia without obstruction or gangrene: Secondary | ICD-10-CM | POA: Diagnosis not present

## 2022-04-17 DIAGNOSIS — S8992XA Unspecified injury of left lower leg, initial encounter: Secondary | ICD-10-CM | POA: Diagnosis not present

## 2022-04-17 DIAGNOSIS — M170 Bilateral primary osteoarthritis of knee: Secondary | ICD-10-CM | POA: Insufficient documentation

## 2022-04-17 DIAGNOSIS — M1712 Unilateral primary osteoarthritis, left knee: Secondary | ICD-10-CM | POA: Diagnosis not present

## 2022-04-17 DIAGNOSIS — R0902 Hypoxemia: Secondary | ICD-10-CM | POA: Diagnosis not present

## 2022-04-17 DIAGNOSIS — M25561 Pain in right knee: Secondary | ICD-10-CM | POA: Diagnosis not present

## 2022-04-17 DIAGNOSIS — M8580 Other specified disorders of bone density and structure, unspecified site: Secondary | ICD-10-CM | POA: Diagnosis not present

## 2022-04-17 DIAGNOSIS — Z7689 Persons encountering health services in other specified circumstances: Secondary | ICD-10-CM | POA: Diagnosis not present

## 2022-04-17 DIAGNOSIS — N390 Urinary tract infection, site not specified: Secondary | ICD-10-CM

## 2022-04-17 DIAGNOSIS — J90 Pleural effusion, not elsewhere classified: Secondary | ICD-10-CM | POA: Diagnosis not present

## 2022-04-17 DIAGNOSIS — X500XXA Overexertion from strenuous movement or load, initial encounter: Secondary | ICD-10-CM | POA: Diagnosis not present

## 2022-04-17 DIAGNOSIS — M25461 Effusion, right knee: Secondary | ICD-10-CM | POA: Diagnosis not present

## 2022-04-17 DIAGNOSIS — R404 Transient alteration of awareness: Secondary | ICD-10-CM | POA: Diagnosis not present

## 2022-04-17 LAB — BASIC METABOLIC PANEL
Anion gap: 7 (ref 5–15)
BUN: 17 mg/dL (ref 8–23)
CO2: 28 mmol/L (ref 22–32)
Calcium: 8.2 mg/dL — ABNORMAL LOW (ref 8.9–10.3)
Chloride: 106 mmol/L (ref 98–111)
Creatinine, Ser: 0.47 mg/dL (ref 0.44–1.00)
GFR, Estimated: >60 mL/min (ref >60)
Glucose, Bld: 102 mg/dL — ABNORMAL HIGH (ref 70–99)
Potassium: 3.3 mmol/L — ABNORMAL LOW (ref 3.5–5.1)
Sodium: 141 mmol/L (ref 135–145)

## 2022-04-17 LAB — CBC WITH DIFFERENTIAL/PLATELET
Abs Immature Granulocytes: 0.08 10*3/uL — ABNORMAL HIGH (ref 0.00–0.07)
Basophils Absolute: 0 10*3/uL (ref 0.0–0.1)
Basophils Relative: 0 %
Eosinophils Absolute: 0.2 10*3/uL (ref 0.0–0.5)
Eosinophils Relative: 1 %
HCT: 37.9 % (ref 36.0–46.0)
Hemoglobin: 11.6 g/dL — ABNORMAL LOW (ref 12.0–15.0)
Immature Granulocytes: 1 %
Lymphocytes Relative: 11 %
Lymphs Abs: 1.4 10*3/uL (ref 0.7–4.0)
MCH: 26.1 pg (ref 26.0–34.0)
MCHC: 30.6 g/dL (ref 30.0–36.0)
MCV: 85.2 fL (ref 80.0–100.0)
Monocytes Absolute: 0.6 10*3/uL (ref 0.1–1.0)
Monocytes Relative: 5 %
Neutro Abs: 10.6 10*3/uL — ABNORMAL HIGH (ref 1.7–7.7)
Neutrophils Relative %: 82 %
Platelets: 198 10*3/uL (ref 150–400)
RBC: 4.45 MIL/uL (ref 3.87–5.11)
RDW: 17 % — ABNORMAL HIGH (ref 11.5–15.5)
WBC: 12.9 10*3/uL — ABNORMAL HIGH (ref 4.0–10.5)
nRBC: 0 % (ref 0.0–0.2)

## 2022-04-17 LAB — URINALYSIS, ROUTINE W REFLEX MICROSCOPIC
Bilirubin Urine: NEGATIVE
Glucose, UA: NEGATIVE mg/dL
Ketones, ur: 20 mg/dL — AB
Nitrite: NEGATIVE
Protein, ur: 30 mg/dL — AB
Specific Gravity, Urine: 1.025 (ref 1.005–1.030)
pH: 6 (ref 5.0–8.0)

## 2022-04-17 LAB — BLOOD GAS, ARTERIAL
Acid-Base Excess: 3.6 mmol/L — ABNORMAL HIGH (ref 0.0–2.0)
Bicarbonate: 27.7 mmol/L (ref 20.0–28.0)
O2 Saturation: 93.5 %
Patient temperature: 37
pCO2 arterial: 39 mmHg (ref 32–48)
pH, Arterial: 7.46 — ABNORMAL HIGH (ref 7.35–7.45)
pO2, Arterial: 59 mmHg — ABNORMAL LOW (ref 83–108)

## 2022-04-17 MED ORDER — SODIUM CHLORIDE 0.9 % IV SOLN
1.0000 g | INTRAVENOUS | Status: AC
  Administered 2022-04-17: 1 g via INTRAVENOUS
  Filled 2022-04-17: qty 10

## 2022-04-17 MED ORDER — IOHEXOL 350 MG/ML SOLN
75.0000 mL | Freq: Once | INTRAVENOUS | Status: AC | PRN
  Administered 2022-04-17: 75 mL via INTRAVENOUS

## 2022-04-17 NOTE — Consult Note (Signed)
Hospitalist Consultation History and Physical    Maureen Ortiz:[id number] DOB: 03/16/1951 DOA: 04/17/2022  DOS: the patient was seen and examined on 04/17/2022  PCP: Jethro Bastos, MD   Patient coming from: SNF([facility])  I have personally briefly reviewed patient's old medical records in [place] [facility]  CC: right femur fracture HPI: 71 yo WF with hx of congenital hydrocephalus, now bedbound, lives at [facility] sent to ER for evaluation of a distal right femur fracture.  Apparently this was seen on x-rays performed at the nursing home [facility]) several days ago.  Patient is nonverbal.  She cannot give any description of what happened.  Imaging of her femur showed a comminuted distal femur fracture.  EDP is reached out to orthopedics who says that this is not an operative fracture.  Patient is also bedbound.  She is nonambulatory.  Discussed the case with her daughter Maureen Ortiz cell phone [phone] and also the patient's Sister Maureen Ortiz phone number [phone] and gave them both an update on the plan of action.  Confirmed with the patient's Sister Maureen Ortiz that the patient is DNR.  DNR form was not sent with her from the facility.  Given that the patient has a nonoperative fracture and there is no need for hospitalization, [facility] hospitalist will not admit the patient.   ED Course: Imaging shows comminuted distal right femur fracture.  Review of Systems:  Review of Systems  Unable to perform ROS: Patient nonverbal    Past Medical History:  Diagnosis Date   Depression    DVT of lower extremity (deep venous thrombosis) (HCC)    Falls frequently    Hard of hearing    Hydrocephalus (HCC)    Hypertension    Incontinent of urine    Seizures (HCC)    Vertigo     Past Surgical History:  Procedure Laterality Date   BRAIN SURGERY     multiple   BREAST BIOPSY     benign x 2   COCHLEAR IMPLANT     left   CSF SHUNT     KNEE SURGERY     x 3   LAPAROSCOPIC REVISION  VENTRICULAR-PERITONEAL (V-P) SHUNT Right 02/09/2015   Procedure: LAPAROSCOPIC REVISION VENTRICULAR-PERITONEAL (V-P) SHUNT;  Surgeon: Tia Alert, MD;  Location: [facility];  Service: Neurosurgery;  Laterality: Right;   paraovarian cyst removal     TUBAL LIGATION     VENTRICULOPERITONEAL SHUNT Right 02/09/2015   Procedure: SHUNT INSERTION VENTRICULAR-PERITONEAL;  Surgeon: Tia Alert, MD;  Location: [facility];  Service: Neurosurgery;  Laterality: Right;     reports that she has never smoked. She has never used smokeless tobacco. She reports that she does not drink alcohol and does not use drugs.  Allergies  Allergen Reactions   Ciprofloxacin     Unknown reaction, listed as an allergy on sheet from [facility] SNF   Codeine Nausea And Vomiting    Family History  Problem Relation Age of Onset   Stroke Mother    Heart disease Father    Heart disease Mother    Colon cancer Neg Hx     Prior to Admission medications   Medication Sig Start Date End Date Taking? Authorizing Provider  buPROPion (WELLBUTRIN XL) 150 MG 24 hr tablet Take 150 mg by mouth daily.    [provider]  cholecalciferol (VITAMIN D) 1000 UNITS tablet Take 1,000 Units by mouth daily.    [provider]  ferrous sulfate 325 (65 FE)  MG EC tablet Take 325 mg by mouth daily.      [provider]  FLUoxetine HCl 60 MG TABS Take 1 tablet by mouth daily.    [provider]  loratadine (CLARITIN) 10 MG tablet Take 10 mg by mouth daily as needed for allergies.    [provider]  LORazepam (ATIVAN) 1 MG tablet Take 1 mg by mouth every 8 (eight) hours.    [provider]  losartan (COZAAR) 50 MG tablet Take 50 mg by mouth daily.    [provider]  LUTEIN PO Take 1 tablet by mouth daily. 18mg -0.4mg -    [provider]  Multiple Vitamin (MULTI-VITAMIN) tablet Take by mouth.    [provider]  Multiple Vitamins-Minerals (MULTIVITAMINS  THER. W/MINERALS) TABS Take 1 tablet by mouth daily.      [provider]    Physical Exam: Vitals:   04/17/22 1715 04/17/22 1819 04/17/22 1820 04/17/22 1943  BP: (!) 146/88  135/82 (!) 162/96  Pulse: 88  81 82  Resp: (!) 21  (!) 21 (!) 22  Temp:  97.8 F (36.6 C)    TempSrc:      SpO2: 97%  99% 100%  Weight:      Height:        Physical Exam Vitals and nursing note reviewed.  Constitutional:      Comments: Chronically ill.  HENT:     Head:     Comments: Hydrocephalus noted Eyes:     Comments: Eyes are sunken  Cardiovascular:     Rate and Rhythm: Normal rate and regular rhythm.  Pulmonary:     Effort: Pulmonary effort is normal.  Abdominal:     General: Bowel sounds are normal. There is no distension.     Tenderness: There is no abdominal tenderness.  Musculoskeletal:     Comments: Moderate amount of swelling over the right distal femur/right knee.  No bruising noted.  Flexion contractures noted of bilateral lower extremities including knees, ankles  Skin:    Capillary Refill: Capillary refill takes less than 2 seconds.  Neurological:     Comments: Patient is nonverbal.      Labs on Admission: I have personally reviewed following labs and imaging studies  CBC: Recent Labs  Lab 04/17/22 1415  WBC 12.9*  NEUTROABS 10.6*  HGB 11.6*  HCT 37.9  MCV 85.2  PLT 198   Basic Metabolic Panel: Recent Labs  Lab 04/17/22 1415  NA 141  K 3.3*  CL 106  CO2 28  GLUCOSE 102*  BUN 17  CREATININE 0.47  CALCIUM 8.2*   GFR: Estimated Creatinine Clearance: 54.1 mL/min (by C-G formula based on SCr of 0.47 mg/dL). Liver Function Tests: No results for input(s): "AST", "ALT", "ALKPHOS", "BILITOT", "PROT", "ALBUMIN" in the last 168 hours. No results for input(s): "LIPASE", "AMYLASE" in the last 168 hours. No results for input(s): "AMMONIA" in the last 168 hours. Coagulation Profile: No results for input(s): "INR", "PROTIME" in the last 168 hours. Cardiac  Enzymes: No results for input(s): "CKTOTAL", "CKMB", "CKMBINDEX", "TROPONINI", "TROPONINIHS" in the last 168 hours. BNP (last 3 results) No results for input(s): "PROBNP" in the last 8760 hours. HbA1C: No results for input(s): "HGBA1C" in the last 72 hours. CBG: No results for input(s): "GLUCAP" in the last 168 hours. Lipid Profile: No results for input(s): "CHOL", "HDL", "LDLCALC", "TRIG", "CHOLHDL", "LDLDIRECT" in the last 72 hours. Thyroid Function Tests: No results for input(s): "TSH", "T4TOTAL", "FREET4", "T3FREE", "THYROIDAB" in the  last 72 hours. Anemia Panel: No results for input(s): "VITAMINB12", "FOLATE", "FERRITIN", "TIBC", "IRON", "RETICCTPCT" in the last 72 hours. Urine analysis:    Component Value Date/Time   COLORURINE YELLOW 04/17/2022 1736   APPEARANCEUR CLOUDY (A) 04/17/2022 1736   LABSPEC 1.025 04/17/2022 1736   PHURINE 6.0 04/17/2022 1736   GLUCOSEU NEGATIVE 04/17/2022 1736   HGBUR MODERATE (A) 04/17/2022 1736   BILIRUBINUR NEGATIVE 04/17/2022 1736   KETONESUR 20 (A) 04/17/2022 1736   PROTEINUR 30 (A) 04/17/2022 1736   UROBILINOGEN 1.0 02/07/2015 1324   NITRITE NEGATIVE 04/17/2022 1736   LEUKOCYTESUR TRACE (A) 04/17/2022 1736    Radiological Exams on Admission: I have personally reviewed images CT Angio Chest PE W and/or Wo Contrast  Result Date: 04/17/2022 CLINICAL DATA:  Pulmonary embolism (PE) suspected, high prob EXAM: CT ANGIOGRAPHY CHEST WITH CONTRAST TECHNIQUE: Multidetector CT imaging of the chest was performed using the standard protocol during bolus administration of intravenous contrast. Multiplanar CT image reconstructions and MIPs were obtained to evaluate the vascular anatomy. RADIATION DOSE REDUCTION: This exam was performed according to the departmental dose-optimization program which includes automated exposure control, adjustment of the mA and/or kV according to patient size and/or use of iterative reconstruction technique. CONTRAST:  17mL  OMNIPAQUE IOHEXOL 350 MG/ML SOLN COMPARISON:  None Available. FINDINGS: Cardiovascular: Satisfactory opacification of the pulmonary arteries to the segmental level. No evidence of pulmonary embolism. Enlarged right atrium. Reflux of contrast into the IVC and hepatic veins.No pericardial disease. Mild atherosclerosis of the thoracic aorta. Mediastinum/Nodes: No lymphadenopathy. The thyroid is atrophic. Large hiatal hernia with intrathoracic stomach. Lungs/Pleura: Left basilar atelectasis. Right basilar consolidation favored to be atelectasis. Elevated right hemidiaphragm. There is mild interlobular septal thickening and ground-glass opacities in the lung apices. Trace right pleural effusion. No pneumothorax. Upper Abdomen: Large hiatal hernia with intrathoracic stomach. No acute abnormality. Musculoskeletal: There are multiple chronic rib injuries bilaterally. There is a chronic L1 compression deformity without evidence of progressive height loss in comparison to prior exam in may 2016. Review of the MIP images confirms the above findings. IMPRESSION: No evidence of pulmonary embolism.  Mild pulmonary edema. Right basilar consolidation favored to be atelectasis. Trace right pleural effusion. Large hiatal hernia with intrathoracic stomach and adjacent left basilar atelectasis. Electronically Signed   By: Caprice Renshaw M.D.   On: 04/17/2022 19:13   DG Chest 2 View  Result Date: 04/17/2022 CLINICAL DATA:  Hypoxia EXAM: CHEST - 2 VIEW COMPARISON:  Radiographs 08/28/2015 FINDINGS: VP shunt catheter tubing traverses the chest. Calcified tubing traverses the left chest. Normal cardiomediastinal silhouette. Leftward rotation. Large hiatal hernia. Pulmonary vascular congestion and mild interstitial prominence on the right. Small right pleural effusion. Remote right rib fractures. No acute osseous abnormality. IMPRESSION: Mild pulmonary vascular congestion and interstitial prominence greater on the right. Small right pleural  effusion. Findings are nonspecific but may be seen with edema or infection. Electronically Signed   By: Minerva Fester M.D.   On: 04/17/2022 18:16   DG Femur Min 2 Views Left  Result Date: 04/17/2022 CLINICAL DATA:  Pain EXAM: LEFT FEMUR 2 VIEWS; RIGHT FEMUR 2 VIEWS COMPARISON:  None Available. FINDINGS: Evaluation is limited due to patient positioning. Right: Comminuted fracture of the distal metaphysis of the right femur. Diffuse demineralization. Surrounding soft tissue edema. Left: No evidence of displaced fracture or other focal bone lesions. Diffuse demineralization. Soft tissues are unremarkable. IMPRESSION: Comminuted fracture of the distal metaphysis of the right femur. Electronically Signed   By: Allegra Lai  M.D.   On: 04/17/2022 16:26   DG Femur Min 2 Views Right  Result Date: 04/17/2022 CLINICAL DATA:  Pain EXAM: LEFT FEMUR 2 VIEWS; RIGHT FEMUR 2 VIEWS COMPARISON:  None Available. FINDINGS: Evaluation is limited due to patient positioning. Right: Comminuted fracture of the distal metaphysis of the right femur. Diffuse demineralization. Surrounding soft tissue edema. Left: No evidence of displaced fracture or other focal bone lesions. Diffuse demineralization. Soft tissues are unremarkable. IMPRESSION: Comminuted fracture of the distal metaphysis of the right femur. Electronically Signed   By: Allegra Lai M.D.   On: 04/17/2022 16:26   DG Knee Complete 4 Views Right  Result Date: 04/17/2022 CLINICAL DATA:  Trauma, pain EXAM: RIGHT KNEE - COMPLETE 4+ VIEW COMPARISON:  None Available. FINDINGS: Comminuted impacted fracture is seen in the distal metaphysis of right femur. There is over riding of fracture fragments along the lateral aspect. There is soft tissue fullness in suprapatellar bursa suggesting moderate effusion. Osteopenia is seen in the bony structures. Degenerative changes are noted with bony spurs and chondrocalcinosis. Scattered soft tissue calcifications are seen. IMPRESSION:  Comminuted fracture is seen in distal metaphysis of right femur. There is lateral displacement of distal major fracture fragment in relation to the shaft. There is over riding of fracture fragments. There is moderate effusion in suprapatellar bursa. Osteopenia and degenerative changes are noted. Electronically Signed   By: Ernie Avena M.D.   On: 04/17/2022 15:29   DG Knee Complete 4 Views Left  Result Date: 04/17/2022 CLINICAL DATA:  Trauma, pain EXAM: LEFT KNEE - COMPLETE 4+ VIEW COMPARISON:  None Available. FINDINGS: No displaced fracture or dislocation is seen. Osteopenia is seen in bony structures. There is no significant effusion in suprapatellar bursa. Degenerative changes are noted with bony spurs and chondrocalcinosis. There are few small scattered soft tissue calcifications. IMPRESSION: No recent fracture or dislocation is seen in left knee. Osteopenia. Degenerative changes are noted. Electronically Signed   By: Ernie Avena M.D.   On: 04/17/2022 15:26   DG Hip Unilat W or Wo Pelvis 2-3 Views Right  Result Date: 04/17/2022 CLINICAL DATA:  Right hip pain with limited weight-bearing. EXAM: DG HIP (WITH OR WITHOUT PELVIS) 2-3V RIGHT COMPARISON:  None Available. FINDINGS: The bones are demineralized. No evidence of acute fracture, dislocation or femoral head osteonecrosis. There are mild degenerative changes of the hips, sacroiliac joints and lower lumbar spine. The soft tissues appear unremarkable. Ventricular peritoneal shunt catheter noted. IMPRESSION: No evidence of acute fracture or dislocation. If the patient has persistent hip pain or inability to bear weight, follow up imaging may be warranted as acute hip fractures can be radiographically occult in the elderly. Electronically Signed   By: Carey Bullocks M.D.   On: 04/17/2022 14:10    EKG: My personal interpretation of EKG shows: NSR    Assessment/Plan Principal Problem:   Closed fracture of right distal femur (HCC) Active  Problems:   S/P ventricular shunt placement   Hydrocephalus (HCC)   Bedbound   DNR (do not resuscitate)    Assessment and Plan: * Closed fracture of right distal femur (HCC) Patient remain in a knee immobilizer right leg until followed up with orthopedics in 1 to 2 weeks following discharge from the ER.  Recommended to EDP that patient be provided prescription opiates for the next 5 days for pain control in with patient care.  DNR (do not resuscitate) Confirmed with the patient's sister and power of attorney Maureen Ortiz phone number [phone] the patient  is a DNR/DNI.  Bedbound Chronic.  Hydrocephalus (HCC) Chronic.  Daughter states patient had congenital hydrocephalus at birth.  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  S/P ventricular shunt placement Stable.    Code Status: DNR/DNI(Do NOT Intubate) verified with pt's dtr Maureen LandAngela and pt's sister 50mary Maureen Ortiz Family Communication: discussed via phone with pt's dtr angela and pt's sister mary Maureen Ortiz  Disposition Plan: return to SNF  Consults called: EPD has consulted ortho  Admission status: pt does not require hospital admission.  Carollee HerterEric Karma Hiney, DO [facility] 04/17/2022, 8:48 PM

## 2022-04-17 NOTE — Progress Notes (Signed)
Asked to see patient again for intermittent hypoxia. No pneumonia or PE on CTPA. Dtr states pt normally sits upright in wheelchair. Currently lying flat with HOB at 15-30 degrees. Pt in no distress.  Pulse ox wave not reliable. Will check ABG. Discussed with EDP.

## 2022-04-17 NOTE — ED Provider Notes (Signed)
[facility]-EMERGENCY DEPT Provider Note   CSN: [id number] Arrival date & time: 04/17/22  1311     History  Chief Complaint  Patient presents with   Leg Injury    Maureen Ortiz is a 71 y.o. female.  Patient presents via EMS with a chief concern of possible femur fracture.  Patient brought in from [facility] after imaging there showed concern for possible femur fracture.  No fall or trauma was noted.  The patient is nonambulatory other than transfers.  Facility staff concerned the patient had fracture from bearing weight.  Patient's relative at bedside states that patient has been declining over the past few years.  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  This has been ongoing for 30 years.  She has a history of frequent falls, urinary incontinence, hypertension, vertigo, hydrocephalus, depression, and seizures.  No paperwork was available patient's relative is unclear as to which leg was of concern.  I was unable to get an answer at [facility].  Patient is nonverbal and deaf and unable to contribute to history.  HPI     Home Medications Prior to Admission medications   Medication Sig Start Date End Date Taking? Authorizing Provider  buPROPion (WELLBUTRIN XL) 150 MG 24 hr tablet Take 150 mg by mouth daily.    [provider]  cholecalciferol (VITAMIN D) 1000 UNITS tablet Take 1,000 Units by mouth daily.    [provider]  ferrous sulfate 325 (65 FE) MG EC tablet Take 325 mg by mouth daily.      [provider]  FLUoxetine HCl 60 MG TABS Take 1 tablet by mouth daily.    [provider]  loratadine (CLARITIN) 10 MG tablet Take 10 mg by mouth daily as needed for allergies.    [provider]  LORazepam (ATIVAN) 1 MG tablet Take 1 mg by mouth every 8 (eight) hours.    [provider]  losartan (COZAAR) 50 MG tablet Take 50 mg by  mouth daily.    [provider]  LUTEIN PO Take 1 tablet by mouth daily. 18mg -0.4mg -    [provider]  Multiple Vitamin (MULTI-VITAMIN) tablet Take by mouth.    [provider]  Multiple Vitamins-Minerals (MULTIVITAMINS THER. W/MINERALS) TABS Take 1 tablet by mouth daily.      [provider]      Allergies    Codeine    Review of Systems   Review of Systems  Reason unable to perform ROS: Patient nonverbal.    Physical Exam Updated Vital Signs BP (!) 154/91   Pulse 100   Temp 97.8 F (36.6 C) (Oral)   Resp 17   Ht 5\' 3"  (1.6 m)   Wt 60 kg   SpO2 90%   BMI 23.43 kg/m  Physical Exam Vitals and nursing note reviewed.  Constitutional:      General: She is not in acute distress. HENT:     Head: Atraumatic.     Mouth/Throat:     Mouth: Mucous membranes are moist.  Eyes:     Conjunctiva/sclera: Conjunctivae normal.  Cardiovascular:     Rate and Rhythm: Normal rate and regular rhythm.     Pulses: Normal pulses.     Heart sounds: Normal heart sounds.  Pulmonary:     Effort: Pulmonary effort is normal.     Breath sounds: Normal breath sounds.  Musculoskeletal:     Comments: No  deformity noted to either femur.  Patient seems to slightly grimace with palpation of the left knee at the distal femur region  Skin:    General: Skin is warm and dry.     Capillary Refill: Capillary refill takes less than 2 seconds.  Neurological:     Mental Status: She is alert. Mental status is at baseline.     ED Results / Procedures / Treatments   Labs (all labs ordered are listed, but only abnormal results are displayed) Labs Reviewed  CBC WITH DIFFERENTIAL/PLATELET - Abnormal; Notable for the following components:      Result Value   WBC 12.9 (*)    Hemoglobin 11.6 (*)    RDW 17.0 (*)    Neutro Abs 10.6 (*)    Abs Immature Granulocytes 0.08 (*)    All other components within normal limits  BASIC METABOLIC PANEL  URINALYSIS, ROUTINE W  REFLEX MICROSCOPIC    EKG None  Radiology DG Hip Unilat W or Wo Pelvis 2-3 Views Right  Result Date: 04/17/2022 CLINICAL DATA:  Right hip pain with limited weight-bearing. EXAM: DG HIP (WITH OR WITHOUT PELVIS) 2-3V RIGHT COMPARISON:  None Available. FINDINGS: The bones are demineralized. No evidence of acute fracture, dislocation or femoral head osteonecrosis. There are mild degenerative changes of the hips, sacroiliac joints and lower lumbar spine. The soft tissues appear unremarkable. Ventricular peritoneal shunt catheter noted. IMPRESSION: No evidence of acute fracture or dislocation. If the patient has persistent hip pain or inability to bear weight, follow up imaging may be warranted as acute hip fractures can be radiographically occult in the elderly. Electronically Signed   By: Carey Bullocks M.D.   On: 04/17/2022 14:10    Procedures Procedures    Medications Ordered in ED Medications - No data to display  ED Course/ Medical Decision Making/ A&P                           Medical Decision Making Amount and/or Complexity of Data Reviewed Labs: ordered. Radiology: ordered.   Patient presents today with concern for possible femur fracture.  Very little information was available to guide work-up and examination.  I ordered and interpreted imaging including initially plain films of the right hip followed by plain films of the left and right knee.  The initial films of the right hip showed no signs of fracture.  I reexamined the patient and discussed the situation with her family member at bedside.  The patient seemed to grimace with palpation about the left knee but the family member felt that the facility may have suggested a fracture of the right femur.  No review of systems is able to be completed at this time due to the patient's nonverbal nature.  The patient currently has had no pain medication but I have considered starting some based on the agreement scale.  She currently  appears to be relatively comfortable  Patient care is being transferred to [facility], PA-C at shift handoff.  Based on report from facility, it is likely that she has some sort of femur fracture.  Unclear at this time as to which leg it is due to patient's nonverbal nature and lack of actual report from facility.  Imaging of both knees pending at time of shift handoff.  Plan for probable orthopedic consult and hospitalist admission depending on imaging results.       Final Clinical Impression(s) / ED Diagnoses Final diagnoses:  None  Rx / DC Orders ED Discharge Orders     None         Pamala Duffel 04/17/22 1550    Tanda Rockers A, DO 04/17/22 1702

## 2022-04-17 NOTE — Subjective & Objective (Addendum)
CC: right femur fracture HPI: 71 yo WF with hx of congenital hydrocephalus, now bedbound, lives at [facility] sent to ER for evaluation of a distal right femur fracture.  Apparently this was seen on x-rays performed at the nursing home [facility]) several days ago.  Patient is nonverbal.  She cannot give any description of what happened.  Imaging of her femur showed a comminuted distal femur fracture.  EDP is reached out to orthopedics who says that this is not an operative fracture.  Patient is also bedbound.  She is nonambulatory.  Discussed the case with her daughter Maureen Ortiz cell phone [phone] and also the patient's Sister Maureen Ortiz phone number [phone] and gave them both an update on the plan of action.  Confirmed with the patient's Sister Maureen Ortiz that the patient is DNR.  DNR form was not sent with her from the facility.  Given that the patient has a nonoperative fracture and there is no need for hospitalization, [facility] hospitalist will not admit the patient.  **update. Pt noted to have borderline O2 sats. ABG showed PO2 of 59. CTPA negative for PE. Showed atelectasis. Likely due to pt constantly lying on her back.  ED unwilling to keep pt in ER to have SW arrange for O2 at SNF.

## 2022-04-17 NOTE — ED Triage Notes (Signed)
Patient BIBA from [facility] for a right femur fracture from bearing weight. Patient is nonverbal and hard of hearing. EMS placed 4L on patient as patient oxygen was 83% on room air.   124/86 86-HR 18-RR 83% on room air, 100% on 4L nasal cannula

## 2022-04-17 NOTE — Assessment & Plan Note (Signed)
Stable

## 2022-04-17 NOTE — Assessment & Plan Note (Addendum)
-   Evaluated by orthopedic surgery, nonoperative management - Continue knee immobilizer - Follow-up with orthopedic surgery in 1 week for repeat imaging

## 2022-04-17 NOTE — Progress Notes (Signed)
Orthopedic Tech Progress Note Patient Details:  [name] Dec 18, 1950 [id number]  Patient ID: Maureen Ortiz, female   DOB: 1951/01/13, 71 y.o.   MRN: [id number]  Maureen Ortiz 04/17/2022, 8:27 PM Right knee immobilizer applied

## 2022-04-17 NOTE — Assessment & Plan Note (Addendum)
Confirmed with the patient's sister and power of attorney Harlow Asa, the patient is a DNR/DNI.

## 2022-04-17 NOTE — Assessment & Plan Note (Signed)
Chronic.  Daughter states patient had congenital hydrocephalus at birth.  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

## 2022-04-17 NOTE — Assessment & Plan Note (Signed)
Chronic. 

## 2022-04-17 NOTE — ED Provider Notes (Signed)
Physical Exam  BP (!) 140/100   Pulse 89   Temp (!) 97.5 F (36.4 C) (Axillary)   Resp 18   Ht 5\' 3"  (1.6 m)   Wt 60 kg   SpO2 98%   BMI 23.43 kg/m   Physical Exam Vitals and nursing note reviewed.  Constitutional:      Comments: Cachectic appearing, chronically ill  Musculoskeletal:     Comments: Healing abrasion noted to the superior lateral aspect of the right knee.  Compartments are soft.  She has palpable DP and PT pulses.  Legs are held in contractures.     Procedures  Procedures  ED Course / MDM    Results for orders placed or performed during the hospital encounter of 04/17/22  Basic metabolic panel  Result Value Ref Range   Sodium 141 135 - 145 mmol/L   Potassium 3.3 (L) 3.5 - 5.1 mmol/L   Chloride 106 98 - 111 mmol/L   CO2 28 22 - 32 mmol/L   Glucose, Bld 102 (H) 70 - 99 mg/dL   BUN 17 8 - 23 mg/dL   Creatinine, Ser 06/17/22 0.44 - 1.00 mg/dL   Calcium 8.2 (L) 8.9 - 10.3 mg/dL   GFR, Estimated 6.44 >03 mL/min   Anion gap 7 5 - 15  CBC with Differential  Result Value Ref Range   WBC 12.9 (H) 4.0 - 10.5 K/uL   RBC 4.45 3.87 - 5.11 MIL/uL   Hemoglobin 11.6 (L) 12.0 - 15.0 g/dL   HCT >47 42.5 - 95.6 %   MCV 85.2 80.0 - 100.0 fL   MCH 26.1 26.0 - 34.0 pg   MCHC 30.6 30.0 - 36.0 g/dL   RDW 38.7 (H) 56.4 - 33.2 %   Platelets 198 150 - 400 K/uL   nRBC 0.0 0.0 - 0.2 %   Neutrophils Relative % 82 %   Neutro Abs 10.6 (H) 1.7 - 7.7 K/uL   Lymphocytes Relative 11 %   Lymphs Abs 1.4 0.7 - 4.0 K/uL   Monocytes Relative 5 %   Monocytes Absolute 0.6 0.1 - 1.0 K/uL   Eosinophils Relative 1 %   Eosinophils Absolute 0.2 0.0 - 0.5 K/uL   Basophils Relative 0 %   Basophils Absolute 0.0 0.0 - 0.1 K/uL   Immature Granulocytes 1 %   Abs Immature Granulocytes 0.08 (H) 0.00 - 0.07 K/uL  Urinalysis, Routine w reflex microscopic Urine, In & Out Cath  Result Value Ref Range   Color, Urine YELLOW YELLOW   APPearance CLOUDY (A) CLEAR   Specific Gravity, Urine 1.025 1.005 -  1.030   pH 6.0 5.0 - 8.0   Glucose, UA NEGATIVE NEGATIVE mg/dL   Hgb urine dipstick MODERATE (A) NEGATIVE   Bilirubin Urine NEGATIVE NEGATIVE   Ketones, ur 20 (A) NEGATIVE mg/dL   Protein, ur 30 (A) NEGATIVE mg/dL   Nitrite NEGATIVE NEGATIVE   Leukocytes,Ua TRACE (A) NEGATIVE   RBC / HPF 21-50 0 - 5 RBC/hpf   WBC, UA 6-10 0 - 5 WBC/hpf   Bacteria, UA MANY (A) NONE SEEN   Squamous Epithelial / LPF 0-5 0 - 5   Mucus PRESENT    Hyaline Casts, UA PRESENT   Blood gas, arterial  Result Value Ref Range   pH, Arterial 7.46 (H) 7.35 - 7.45   pCO2 arterial 39 32 - 48 mmHg   pO2, Arterial 59 (L) 83 - 108 mmHg   Bicarbonate 27.7 20.0 - 28.0 mmol/L   Acid-Base Excess 3.6 (  H) 0.0 - 2.0 mmol/L   O2 Saturation 93.5 %   Patient temperature 37.0    Allens test (pass/fail) PASS PASS   CT Angio Chest PE W and/or Wo Contrast  Result Date: 04/17/2022 CLINICAL DATA:  Pulmonary embolism (PE) suspected, high prob EXAM: CT ANGIOGRAPHY CHEST WITH CONTRAST TECHNIQUE: Multidetector CT imaging of the chest was performed using the standard protocol during bolus administration of intravenous contrast. Multiplanar CT image reconstructions and MIPs were obtained to evaluate the vascular anatomy. RADIATION DOSE REDUCTION: This exam was performed according to the departmental dose-optimization program which includes automated exposure control, adjustment of the mA and/or kV according to patient size and/or use of iterative reconstruction technique. CONTRAST:  12mL OMNIPAQUE IOHEXOL 350 MG/ML SOLN COMPARISON:  None Available. FINDINGS: Cardiovascular: Satisfactory opacification of the pulmonary arteries to the segmental level. No evidence of pulmonary embolism. Enlarged right atrium. Reflux of contrast into the IVC and hepatic veins.No pericardial disease. Mild atherosclerosis of the thoracic aorta. Mediastinum/Nodes: No lymphadenopathy. The thyroid is atrophic. Large hiatal hernia with intrathoracic stomach. Lungs/Pleura:  Left basilar atelectasis. Right basilar consolidation favored to be atelectasis. Elevated right hemidiaphragm. There is mild interlobular septal thickening and ground-glass opacities in the lung apices. Trace right pleural effusion. No pneumothorax. Upper Abdomen: Large hiatal hernia with intrathoracic stomach. No acute abnormality. Musculoskeletal: There are multiple chronic rib injuries bilaterally. There is a chronic L1 compression deformity without evidence of progressive height loss in comparison to prior exam in may 2016. Review of the MIP images confirms the above findings. IMPRESSION: No evidence of pulmonary embolism.  Mild pulmonary edema. Right basilar consolidation favored to be atelectasis. Trace right pleural effusion. Large hiatal hernia with intrathoracic stomach and adjacent left basilar atelectasis. Electronically Signed   By: Caprice Renshaw M.D.   On: 04/17/2022 19:13   DG Chest 2 View  Result Date: 04/17/2022 CLINICAL DATA:  Hypoxia EXAM: CHEST - 2 VIEW COMPARISON:  Radiographs 08/28/2015 FINDINGS: VP shunt catheter tubing traverses the chest. Calcified tubing traverses the left chest. Normal cardiomediastinal silhouette. Leftward rotation. Large hiatal hernia. Pulmonary vascular congestion and mild interstitial prominence on the right. Small right pleural effusion. Remote right rib fractures. No acute osseous abnormality. IMPRESSION: Mild pulmonary vascular congestion and interstitial prominence greater on the right. Small right pleural effusion. Findings are nonspecific but may be seen with edema or infection. Electronically Signed   By: Minerva Fester M.D.   On: 04/17/2022 18:16   DG Femur Min 2 Views Left  Result Date: 04/17/2022 CLINICAL DATA:  Pain EXAM: LEFT FEMUR 2 VIEWS; RIGHT FEMUR 2 VIEWS COMPARISON:  None Available. FINDINGS: Evaluation is limited due to patient positioning. Right: Comminuted fracture of the distal metaphysis of the right femur. Diffuse demineralization.  Surrounding soft tissue edema. Left: No evidence of displaced fracture or other focal bone lesions. Diffuse demineralization. Soft tissues are unremarkable. IMPRESSION: Comminuted fracture of the distal metaphysis of the right femur. Electronically Signed   By: Allegra Lai M.D.   On: 04/17/2022 16:26   DG Femur Min 2 Views Right  Result Date: 04/17/2022 CLINICAL DATA:  Pain EXAM: LEFT FEMUR 2 VIEWS; RIGHT FEMUR 2 VIEWS COMPARISON:  None Available. FINDINGS: Evaluation is limited due to patient positioning. Right: Comminuted fracture of the distal metaphysis of the right femur. Diffuse demineralization. Surrounding soft tissue edema. Left: No evidence of displaced fracture or other focal bone lesions. Diffuse demineralization. Soft tissues are unremarkable. IMPRESSION: Comminuted fracture of the distal metaphysis of the right femur. Electronically Signed  By: Allegra Lai M.D.   On: 04/17/2022 16:26   DG Knee Complete 4 Views Right  Result Date: 04/17/2022 CLINICAL DATA:  Trauma, pain EXAM: RIGHT KNEE - COMPLETE 4+ VIEW COMPARISON:  None Available. FINDINGS: Comminuted impacted fracture is seen in the distal metaphysis of right femur. There is over riding of fracture fragments along the lateral aspect. There is soft tissue fullness in suprapatellar bursa suggesting moderate effusion. Osteopenia is seen in the bony structures. Degenerative changes are noted with bony spurs and chondrocalcinosis. Scattered soft tissue calcifications are seen. IMPRESSION: Comminuted fracture is seen in distal metaphysis of right femur. There is lateral displacement of distal major fracture fragment in relation to the shaft. There is over riding of fracture fragments. There is moderate effusion in suprapatellar bursa. Osteopenia and degenerative changes are noted. Electronically Signed   By: Ernie Avena M.D.   On: 04/17/2022 15:29   DG Knee Complete 4 Views Left  Result Date: 04/17/2022 CLINICAL DATA:  Trauma,  pain EXAM: LEFT KNEE - COMPLETE 4+ VIEW COMPARISON:  None Available. FINDINGS: No displaced fracture or dislocation is seen. Osteopenia is seen in bony structures. There is no significant effusion in suprapatellar bursa. Degenerative changes are noted with bony spurs and chondrocalcinosis. There are few small scattered soft tissue calcifications. IMPRESSION: No recent fracture or dislocation is seen in left knee. Osteopenia. Degenerative changes are noted. Electronically Signed   By: Ernie Avena M.D.   On: 04/17/2022 15:26   DG Hip Unilat W or Wo Pelvis 2-3 Views Right  Result Date: 04/17/2022 CLINICAL DATA:  Right hip pain with limited weight-bearing. EXAM: DG HIP (WITH OR WITHOUT PELVIS) 2-3V RIGHT COMPARISON:  None Available. FINDINGS: The bones are demineralized. No evidence of acute fracture, dislocation or femoral head osteonecrosis. There are mild degenerative changes of the hips, sacroiliac joints and lower lumbar spine. The soft tissues appear unremarkable. Ventricular peritoneal shunt catheter noted. IMPRESSION: No evidence of acute fracture or dislocation. If the patient has persistent hip pain or inability to bear weight, follow up imaging may be warranted as acute hip fractures can be radiographically occult in the elderly. Electronically Signed   By: Carey Bullocks M.D.   On: 04/17/2022 14:10      Clinical Course as of 04/18/22 0033  Thu Apr 17, 2022  1926 CT Angio Chest PE W and/or Wo Contrast IMPRESSION: No evidence of pulmonary embolism.  Mild pulmonary edema.  Right basilar consolidation favored to be atelectasis. Trace right pleural effusion.  Large hiatal hernia with intrathoracic stomach and adjacent left basilar atelectasis. [RP]  2245 pO2, Arterial(!): 59 Repaged Dr Imogene Burn regarding abg results.  [RP]  2357 Discussed with Dr Imogene Burn who has agreed to admit.  [RP]    Clinical Course User Index [RP] Rondel Baton, MD   Medical Decision Making Amount and/or  Complexity of Data Reviewed Labs: ordered. Decision-making details documented in ED Course. Radiology: ordered. Decision-making details documented in ED Course.  Risk Prescription drug management. Decision regarding hospitalization.   Accepted handoff at shift change from [facility], [place]. Please see prior provider note for more detail.   Briefly: Patient is 71 y.o. F presenting for possible femur fracture.   DDX: concern for fracture versus contusion  Plan: Follow up on XR imaging.   Patient is nonverbal, nonambulatory patient that resides in a nursing home facility.  She has a questionable femur fracture?.  Nursing staff at [facility] reported that yesterday they noticed some swelling to her right knee and  had an x-ray.  She was sent over here for "femur fracture".  Imaging shows comminuted fracture is seen in distal metaphysis of right femur. There is lateral displacement of distal major fracture fragment in relation to the shaft. There is over riding of fracture fragments. There is moderate effusion in suprapatellar bursa. Osteopenia and degenerative changes are noted.  Consult to orthopedics was placed. Family expressed that they do not wish to proceed with surgery unless this will improve her quality of life. Spoke with Dr. Blanchie Dessert who recommends knee immobilizer if the patient can tolerate and follow up with him in a week for re-evaluation.   Knee immobilizer in place. Cap refill intact and pulses palpable.   Unknown of why the patient was having hypoxia. Patient was 79% n RA. She initially presented with EMS at 83% on RA and was placed on 4L. She for some reason was removed and became hypoxic again. She was palced on 3L and returned to baseline soon. Patient is unable to communicate if she feels SOB or not. Will order imaging.  CXR shows Mild pulmonary vascular congestion and interstitial prominence greater on the right. Small right pleural effusion. Findings are nonspecific  but may be seen with edema or infection. Will order CTA for concern for possible fat emboli from long bone fracture or PE from bed ridden stated  CT chest shows No evidence of pulmonary embolism.  Mild pulmonary edema. Right basilar consolidation favored to be atelectasis. Trace right pleural effusion. Large hiatal hernia with intrathoracic stomach and adjacent left basilar atelectasis.   Patient was removed off her oxygen by Dr. Imogene Burn and she went to 82% after a few minutes with good pleth.   Dr. Imogene Burn does not want to admit the patient for her hypoxia. Will hand this off to my attending for further discussion.    I discussed this case with my attending physician who cosigned this note including patient's presenting symptoms, physical exam, and planned diagnostics and interventions. Attending physician stated agreement with plan or made changes to plan which were implemented.   Attending physician assessed patient at bedside.  11:14 PM Care of [facility] Ortiz transferred  Dr. Eloise Harman at the end of my shift as the patient will require reassessment once labs/imaging have resulted. Patient presentation, ED course, and plan of care discussed with review of all pertinent labs and imaging. Please see his/her note for further details regarding further ED course and disposition. Plan at time of handoff is follow up on admission. This may be altered or completely changed at the discretion of the oncoming team pending results of further workup.        Achille Rich, PA-C 04/18/22 0047    Rondel Baton, MD 04/18/22 [phone]

## 2022-04-18 DIAGNOSIS — J9601 Acute respiratory failure with hypoxia: Secondary | ICD-10-CM

## 2022-04-18 DIAGNOSIS — R4182 Altered mental status, unspecified: Secondary | ICD-10-CM | POA: Diagnosis not present

## 2022-04-18 DIAGNOSIS — G919 Hydrocephalus, unspecified: Secondary | ICD-10-CM | POA: Diagnosis not present

## 2022-04-18 DIAGNOSIS — S72401A Unspecified fracture of lower end of right femur, initial encounter for closed fracture: Secondary | ICD-10-CM | POA: Diagnosis not present

## 2022-04-18 DIAGNOSIS — S72491A Other fracture of lower end of right femur, initial encounter for closed fracture: Secondary | ICD-10-CM | POA: Diagnosis not present

## 2022-04-18 DIAGNOSIS — Z7401 Bed confinement status: Secondary | ICD-10-CM | POA: Diagnosis not present

## 2022-04-18 DIAGNOSIS — Z66 Do not resuscitate: Secondary | ICD-10-CM | POA: Diagnosis not present

## 2022-04-18 DIAGNOSIS — R5383 Other fatigue: Secondary | ICD-10-CM | POA: Diagnosis not present

## 2022-04-18 DIAGNOSIS — Z982 Presence of cerebrospinal fluid drainage device: Secondary | ICD-10-CM | POA: Diagnosis not present

## 2022-04-18 MED ORDER — ACETAMINOPHEN 650 MG RE SUPP: 650.0000 mg | Freq: Four times a day (QID) | RECTAL | Status: DC | PRN

## 2022-04-18 MED ORDER — ACETAMINOPHEN 325 MG PO TABS: 650.0000 mg | ORAL_TABLET | Freq: Four times a day (QID) | ORAL | Status: DC | PRN

## 2022-04-18 MED ORDER — ORAL CARE MOUTH RINSE
15.0000 mL | OROMUCOSAL | Status: DC | PRN
Start: 2022-04-18 — End: 2022-04-18

## 2022-04-18 MED ORDER — OXYCODONE HCL 5 MG PO TABS: 2.5000 mg | ORAL_TABLET | ORAL | Status: DC | PRN

## 2022-04-18 MED ORDER — ORAL CARE MOUTH RINSE: 15.0000 mL | OROMUCOSAL | Status: DC

## 2022-04-18 NOTE — TOC Transition Note (Signed)
Transition of Care [facility]) - CM/SW Discharge Note  Patient Details  Name: MIKAYLEE ARSENEAU MRN: [id number] Date of Birth: [birth year]  [facility] CM/SW Contact:  Ewing Schlein, LCSW Phone Number: 04/18/2022, 1:17 PM  Clinical Narrative: Patient will return to [facility] with 2L/min O2. CSW confirmed with patient's father, Sabino Gasser, that patient resides in the SNF section of [facility]. CSW spoke with Neb[place] at [facility] and confirmed that patient can return today with O2. Patient will not need a new FL2 as patient was under observation for less than 24 hours. Discharge summary, discharge orders, and SNF transfer report sent to facility in hub. The number for report is [id number]. Medical necessity form done; PTAR scheduled. Discharge packet completed. RN updated. TOC signing off.  Final next level of care: Skilled Nursing Facility Barriers to Discharge: No Barriers Identified  Patient Goals and CMS Choice Patient states their goals for this hospitalization and ongoing recovery are:: Return to [facility] SN[facility] CMS Medicare.gov Compare Post Acute Care list provided to:: Patient Represenative (must comment) Choice offered to / list presented to : Parent  Discharge Placement      Patient chooses bed at: [facility] Patient to be transferred to facility by: PTAR Name of family member notified: Sabino Gasser (father) Patient and family notified of of transfer: 04/18/22  Discharge Plan and Services      DME Arranged: N/A DME Agency: NA  Readmission Risk Interventions     No data to display

## 2022-04-18 NOTE — Discharge Summary (Signed)
Physician Discharge Summary   Maureen Ortiz [id number] DOB: 06/14/1951 DOA: 04/17/2022  PCP: Daylene Posey, MD  Admit date: 04/17/2022 Discharge date:  04/18/2022 Barriers to discharge: None  Admitted From: [place] Disposition:  [place] Discharging physician: Dwyane Dee, MD  Recommendations for Outpatient Follow-up:  Continue O2; can try to wean as able Continue knee immobilizer and daily skin checks Needs appointment in 1 week with orthopedic surgery for follow-up x-rays   Discharge Condition: stable CODE STATUS: DNR Diet recommendation:  Diet Orders (From admission, onward)     Start     Ordered   04/18/22 0229  DIET DYS 2 Room service appropriate? Yes; Fluid consistency: Thin  Diet effective now       Question Answer Comment  Room service appropriate? Yes   Fluid consistency: Thin      04/18/22 0228            Hospital Course: 71 yo WF with hx of congenital hydrocephalus, now bedbound, lives at [facility] sent to ER for evaluation of a distal right femur fracture.  Apparently this was seen on x-rays performed at the nursing home [facility]) several days ago.  Patient is nonverbal.  She cannot give any description of what happened.   Imaging of her femur showed a comminuted distal femur fracture.  EDP reached out to orthopedics who says that this is not an operative fracture.  Patient is also bedbound.  She is nonambulatory.   Discussed the case with her daughter Maureen Ortiz an d also the patient's Sister Maureen Ortiz.   Confirmed with the patient's Sister Maureen Ortiz that the patient is DNR.  Pt noted to have borderline O2 sats. ABG showed PO2 of 59. CTPA negative for PE. Showed atelectasis. Likely due to pt constantly lying on her back.   Patient admitted overnight for observation and arranging oxygen for discharge back to SNF.  Assessment and Plan: * Acute respiratory failure with hypoxia ([place]) - Suspected due to atelectasis and chronic bedbound  status -Continue oxygen at discharge, 2 L.  Can try to attempt and wean as able  Closed fracture of right distal femur ([place]) - Evaluated by orthopedic surgery, nonoperative management - Continue knee immobilizer - Follow-up with orthopedic surgery in 1 week for repeat imaging  DNR (do not resuscitate) Confirmed with the patient's sister and power of attorney Maureen Ortiz, the patient is a DNR/DNI.  Bedbound Chronic.  Hydrocephalus (HCC) Chronic.  Daughter states patient had congenital hydrocephalus at birth.  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  S/P ventricular shunt placement Stable.   Principal Diagnosis: Acute respiratory failure with hypoxia [facility])  Discharge Diagnoses: Active Hospital Problems   Diagnosis Date Noted   Acute respiratory failure with hypoxia ([place]) 04/18/2022    Priority: High   Closed fracture of right distal femur ([place]) 04/17/2022    Priority: High   Hydrocephalus ([place]) 04/17/2022    Priority: Low   Bedbound 04/17/2022    Priority: Low   DNR (do not resuscitate) 04/17/2022    Priority: Low   S/P ventricular shunt placement 02/09/2015    Priority: [facility] Problems  No resolved problems to display.     Discharge Instructions     Increase activity slowly   Complete by: As directed       Allergies as of 04/18/2022       Reactions   Ciprofloxacin    Unknown reaction, listed as an allergy on sheet from [facility]  Codeine Nausea And Vomiting        Medication List     TAKE these medications    ACETAMINOPHEN PO Take 650 mg by mouth every 6 (six) hours as needed for mild pain or moderate pain.   Dulcolax 10 MG suppository Generic drug: bisacodyl Place 10 mg rectally daily as needed for moderate constipation.   famotidine 20 MG tablet Commonly known as: PEPCID Take 20 mg by mouth daily as needed for heartburn or indigestion.   FLUoxetine 20 MG tablet Commonly known as: PROZAC Take  20 mg by mouth daily.   mirtazapine 15 MG tablet Commonly known as: REMERON Take 7.5 mg by mouth at bedtime.   polyethylene glycol 17 g packet Commonly known as: MIRALAX / GLYCOLAX Take 17 g by mouth daily.   PRO-STAT RENAL CARE PO Take 30 mLs by mouth daily.   sennosides-docusate sodium 8.6-50 MG tablet Commonly known as: SENOKOT-S Take 1 tablet by mouth in the morning and at bedtime.   valsartan 40 MG tablet Commonly known as: DIOVAN Take 20 mg by mouth daily.   VITAMIN D PO Take 50,000 Units by mouth every 30 (thirty) days.               Durable Medical Equipment  (From admission, onward)           Start     Ordered   04/18/22 1113  DME Oxygen  Once       Question Answer Comment  Length of Need Lifetime   Mode or (Route) Nasal cannula   Liters per Minute 2   Frequency Continuous (stationary and portable oxygen unit needed)   Oxygen conserving device Yes   Oxygen delivery system Gas      04/18/22 1112            Contact information for follow-up providers     Joen Laura, MD. Schedule an appointment as soon as possible for a visit in 1 week(s).   Specialty: Orthopedic Surgery Why: For repeat xrays Contact information: [address] [place] [address] [phone]              Contact information for after-discharge care     Destination     [facility] .   Service: Skilled Nursing Contact information: [address] [place] [address] [phone]                    Allergies  Allergen Reactions   Ciprofloxacin     Unknown reaction, listed as an allergy on sheet from [facility]   Codeine Nausea And Vomiting    Consultations: Orthopedic surgery  Procedures:   Discharge Exam: BP 133/81 (BP Location: Left Arm)   Pulse 77   Temp 97.8 F (36.6 C) (Oral)   Resp 18   Ht 5\' 3"  (1.6 m)   Wt 57.7 kg   SpO2 99%   BMI 22.53 kg/m  Physical  Exam Constitutional:      General: She is not in acute distress.    Comments: Chronically ill-appearing elderly woman lying in bed nonverbal and in no distress  HENT:     Head: Normocephalic and atraumatic.     Mouth/Throat:     Mouth: Mucous membranes are moist.  Eyes:     Extraocular Movements: Extraocular movements intact.     Pupils: Pupils are equal, round, and reactive to light.  Cardiovascular:     Rate and Rhythm: Normal  rate and regular rhythm.  Pulmonary:     Effort: Pulmonary effort is normal.     Breath sounds: Normal breath sounds.  Abdominal:     General: Bowel sounds are normal. There is no distension.     Palpations: Abdomen is soft.     Tenderness: There is no abdominal tenderness.  Musculoskeletal:     Cervical back: Normal range of motion and neck supple.     Comments: Right knee immobilizer in place with no significant tenderness with passive range of motion  Skin:    General: Skin is warm.  Neurological:     Comments: Squeezes left hand to command.  Nonverbal      The results of significant diagnostics from this hospitalization (including imaging, microbiology, ancillary and laboratory) are listed below for reference.   Microbiology: No results found for this or any previous visit (from the past 240 hour(s)).   Labs: BNP (last 3 results) No results for input(s): "BNP" in the last 8760 hours. Basic Metabolic Panel: Recent Labs  Lab 04/17/22 1415  NA 141  K 3.3*  CL 106  CO2 28  GLUCOSE 102*  BUN 17  CREATININE 0.47  CALCIUM 8.2*   Liver Function Tests: No results for input(s): "AST", "ALT", "ALKPHOS", "BILITOT", "PROT", "ALBUMIN" in the last 168 hours. No results for input(s): "LIPASE", "AMYLASE" in the last 168 hours. No results for input(s): "AMMONIA" in the last 168 hours. CBC: Recent Labs  Lab 04/17/22 1415  WBC 12.9*  NEUTROABS 10.6*  HGB 11.6*  HCT 37.9  MCV 85.2  PLT 198   Cardiac Enzymes: No results for input(s): "CKTOTAL",  "CKMB", "CKMBINDEX", "TROPONINI" in the last 168 hours. BNP: Invalid input(s): "POCBNP" CBG: No results for input(s): "GLUCAP" in the last 168 hours. D-Dimer No results for input(s): "DDIMER" in the last 72 hours. Hgb A1c No results for input(s): "HGBA1C" in the last 72 hours. Lipid Profile No results for input(s): "CHOL", "HDL", "LDLCALC", "TRIG", "CHOLHDL", "LDLDIRECT" in the last 72 hours. Thyroid function studies No results for input(s): "TSH", "T4TOTAL", "T3FREE", "THYROIDAB" in the last 72 hours.  Invalid input(s): "FREET3" Anemia work up No results for input(s): "VITAMINB12", "FOLATE", "FERRITIN", "TIBC", "IRON", "RETICCTPCT" in the last 72 hours. Urinalysis    Component Value Date/Time   COLORURINE YELLOW 04/17/2022 1736   APPEARANCEUR CLOUDY (A) 04/17/2022 1736   LABSPEC 1.025 04/17/2022 1736   PHURINE 6.0 04/17/2022 1736   GLUCOSEU NEGATIVE 04/17/2022 1736   HGBUR MODERATE (A) 04/17/2022 1736   BILIRUBINUR NEGATIVE 04/17/2022 1736   KETONESUR 20 (A) 04/17/2022 1736   PROTEINUR 30 (A) 04/17/2022 1736   UROBILINOGEN 1.0 02/07/2015 1324   NITRITE NEGATIVE 04/17/2022 1736   LEUKOCYTESUR TRACE (A) 04/17/2022 1736   Sepsis Labs Recent Labs  Lab 04/17/22 1415  WBC 12.9*   Microbiology No results found for this or any previous visit (from the past 240 hour(s)).  Procedures/Studies: CT Angio Chest PE W and/or Wo Contrast  Result Date: 04/17/2022 CLINICAL DATA:  Pulmonary embolism (PE) suspected, high prob EXAM: CT ANGIOGRAPHY CHEST WITH CONTRAST TECHNIQUE: Multidetector CT imaging of the chest was performed using the standard protocol during bolus administration of intravenous contrast. Multiplanar CT image reconstructions and MIPs were obtained to evaluate the vascular anatomy. RADIATION DOSE REDUCTION: This exam was performed according to the departmental dose-optimization program which includes automated exposure control, adjustment of the mA and/or kV according to  patient size and/or use of iterative reconstruction technique. CONTRAST:  11mL OMNIPAQUE IOHEXOL 350 MG/ML SOLN  COMPARISON:  None Available. FINDINGS: Cardiovascular: Satisfactory opacification of the pulmonary arteries to the segmental level. No evidence of pulmonary embolism. Enlarged right atrium. Reflux of contrast into the IVC and hepatic veins.No pericardial disease. Mild atherosclerosis of the thoracic aorta. Mediastinum/Nodes: No lymphadenopathy. The thyroid is atrophic. Large hiatal hernia with intrathoracic stomach. Lungs/Pleura: Left basilar atelectasis. Right basilar consolidation favored to be atelectasis. Elevated right hemidiaphragm. There is mild interlobular septal thickening and ground-glass opacities in the lung apices. Trace right pleural effusion. No pneumothorax. Upper Abdomen: Large hiatal hernia with intrathoracic stomach. No acute abnormality. Musculoskeletal: There are multiple chronic rib injuries bilaterally. There is a chronic L1 compression deformity without evidence of progressive height loss in comparison to prior exam in may 2016. Review of the MIP images confirms the above findings. IMPRESSION: No evidence of pulmonary embolism.  Mild pulmonary edema. Right basilar consolidation favored to be atelectasis. Trace right pleural effusion. Large hiatal hernia with intrathoracic stomach and adjacent left basilar atelectasis. Electronically Signed   By: Maurine Simmering M.D.   On: 04/17/2022 19:13   DG Chest 2 View  Result Date: 04/17/2022 CLINICAL DATA:  Hypoxia EXAM: CHEST - 2 VIEW COMPARISON:  Radiographs 08/28/2015 FINDINGS: VP shunt catheter tubing traverses the chest. Calcified tubing traverses the left chest. Normal cardiomediastinal silhouette. Leftward rotation. Large hiatal hernia. Pulmonary vascular congestion and mild interstitial prominence on the right. Small right pleural effusion. Remote right rib fractures. No acute osseous abnormality. IMPRESSION: Mild pulmonary vascular  congestion and interstitial prominence greater on the right. Small right pleural effusion. Findings are nonspecific but may be seen with edema or infection. Electronically Signed   By: Placido Sou M.D.   On: 04/17/2022 18:16   DG Femur Min 2 Views Left  Result Date: 04/17/2022 CLINICAL DATA:  Pain EXAM: LEFT FEMUR 2 VIEWS; RIGHT FEMUR 2 VIEWS COMPARISON:  None Available. FINDINGS: Evaluation is limited due to patient positioning. Right: Comminuted fracture of the distal metaphysis of the right femur. Diffuse demineralization. Surrounding soft tissue edema. Left: No evidence of displaced fracture or other focal bone lesions. Diffuse demineralization. Soft tissues are unremarkable. IMPRESSION: Comminuted fracture of the distal metaphysis of the right femur. Electronically Signed   By: Yetta Glassman M.D.   On: 04/17/2022 16:26   DG Femur Min 2 Views Right  Result Date: 04/17/2022 CLINICAL DATA:  Pain EXAM: LEFT FEMUR 2 VIEWS; RIGHT FEMUR 2 VIEWS COMPARISON:  None Available. FINDINGS: Evaluation is limited due to patient positioning. Right: Comminuted fracture of the distal metaphysis of the right femur. Diffuse demineralization. Surrounding soft tissue edema. Left: No evidence of displaced fracture or other focal bone lesions. Diffuse demineralization. Soft tissues are unremarkable. IMPRESSION: Comminuted fracture of the distal metaphysis of the right femur. Electronically Signed   By: Yetta Glassman M.D.   On: 04/17/2022 16:26   DG Knee Complete 4 Views Right  Result Date: 04/17/2022 CLINICAL DATA:  Trauma, pain EXAM: RIGHT KNEE - COMPLETE 4+ VIEW COMPARISON:  None Available. FINDINGS: Comminuted impacted fracture is seen in the distal metaphysis of right femur. There is over riding of fracture fragments along the lateral aspect. There is soft tissue fullness in suprapatellar bursa suggesting moderate effusion. Osteopenia is seen in the bony structures. Degenerative changes are noted with bony spurs  and chondrocalcinosis. Scattered soft tissue calcifications are seen. IMPRESSION: Comminuted fracture is seen in distal metaphysis of right femur. There is lateral displacement of distal major fracture fragment in relation to the shaft. There is over riding of fracture  fragments. There is moderate effusion in suprapatellar bursa. Osteopenia and degenerative changes are noted. Electronically Signed   By: Ernie Avena M.D.   On: 04/17/2022 15:29   DG Knee Complete 4 Views Left  Result Date: 04/17/2022 CLINICAL DATA:  Trauma, pain EXAM: LEFT KNEE - COMPLETE 4+ VIEW COMPARISON:  None Available. FINDINGS: No displaced fracture or dislocation is seen. Osteopenia is seen in bony structures. There is no significant effusion in suprapatellar bursa. Degenerative changes are noted with bony spurs and chondrocalcinosis. There are few small scattered soft tissue calcifications. IMPRESSION: No recent fracture or dislocation is seen in left knee. Osteopenia. Degenerative changes are noted. Electronically Signed   By: Ernie Avena M.D.   On: 04/17/2022 15:26   DG Hip Unilat W or Wo Pelvis 2-3 Views Right  Result Date: 04/17/2022 CLINICAL DATA:  Right hip pain with limited weight-bearing. EXAM: DG HIP (WITH OR WITHOUT PELVIS) 2-3V RIGHT COMPARISON:  None Available. FINDINGS: The bones are demineralized. No evidence of acute fracture, dislocation or femoral head osteonecrosis. There are mild degenerative changes of the hips, sacroiliac joints and lower lumbar spine. The soft tissues appear unremarkable. Ventricular peritoneal shunt catheter noted. IMPRESSION: No evidence of acute fracture or dislocation. If the patient has persistent hip pain or inability to bear weight, follow up imaging may be warranted as acute hip fractures can be radiographically occult in the elderly. Electronically Signed   By: Carey Bullocks M.D.   On: 04/17/2022 14:10     Time coordinating discharge: Over 30 minutes    Lewie Chamber, MD  [facility] 04/18/2022, 11:22 AM

## 2022-04-18 NOTE — Consult Note (Signed)
ORTHOPAEDIC CONSULTATION  REQUESTING PHYSICIAN: Lewie Chamber, MD  Chief Complaint: Right distal femur fracture  HPI: Maureen Ortiz is a 71 y.o. female who is nonverbal and nonambulatory lives at a nursing facility presented to the ER yesterday with concerns for right leg injury.  Unclear mechanism or when the injury happened.  X-rays demonstrated a minimally displaced distal femur fracture.  Patient does not show any signs of pain or discomfort with manipulation.  Recommended to the ER immobilization in knee immobilizer and follow-up in clinic in 1 week.  She was admitted to the hospitalist for acute respiratory failure with hypoxia.  Past Medical History:  Diagnosis Date   Depression    DVT of lower extremity (deep venous thrombosis) (HCC)    Falls frequently    Hard of hearing    Hydrocephalus (HCC)    Hypertension    Incontinent of urine    Seizures (HCC)    Vertigo    Past Surgical History:  Procedure Laterality Date   BRAIN SURGERY     multiple   BREAST BIOPSY     benign x 2   COCHLEAR IMPLANT     left   CSF SHUNT     KNEE SURGERY     x 3   LAPAROSCOPIC REVISION VENTRICULAR-PERITONEAL (V-P) SHUNT Right 02/09/2015   Procedure: LAPAROSCOPIC REVISION VENTRICULAR-PERITONEAL (V-P) SHUNT;  Surgeon: Tia Alert, MD;  Location: [facility];  Service: Neurosurgery;  Laterality: Right;   paraovarian cyst removal     TUBAL LIGATION     VENTRICULOPERITONEAL SHUNT Right 02/09/2015   Procedure: SHUNT INSERTION VENTRICULAR-PERITONEAL;  Surgeon: Tia Alert, MD;  Location: [facility];  Service: Neurosurgery;  Laterality: Right;   Social History   Socioeconomic History   Marital status: Divorced    Spouse name: Not on file   Number of children: 1   Years of education: Not on file   Highest education level: Not on file  Occupational History   Not on file  Tobacco Use   Smoking status: Never   Smokeless tobacco: Never  Substance and Sexual Activity   Alcohol use: No    Drug use: No   Sexual activity: Not on file  Other Topics Concern   Not on file  Social History Narrative   Not on file   Social Determinants of Health   Financial Resource Strain: Not on file  Food Insecurity: Not on file  Transportation Needs: Not on file  Physical Activity: Not on file  Stress: Not on file  Social Connections: Not on file   Family History  Problem Relation Age of Onset   Stroke Mother    Heart disease Father    Heart disease Mother    Colon cancer Neg Hx    Allergies  Allergen Reactions   Ciprofloxacin     Unknown reaction, listed as an allergy on sheet from [facility]   Codeine Nausea And Vomiting     Positive ROS: All other systems have been reviewed and were otherwise negative with the exception of those mentioned in the HPI and as above.  Physical Exam: General: Alert, no acute distress Cardiovascular: No pedal edema Respiratory: No cyanosis, no use of accessory musculature Skin: No lesions in the area of chief complaint Neurologic: Sensation intact distally  MUSCULOSKELETAL:  RLE No traumatic wounds, ecchymosis, or rash  Nontender  No groin pain with log roll  Mild swelling about the knee  Patient unable to participate with motorsensory exam  DP  2+, No significant edema    IMAGING: xrays demonstrate minimally displaced distal femur fracture  Assessment: Principal Problem:   Acute respiratory failure with hypoxia (HCC) Active Problems:   S/P ventricular shunt placement   Closed fracture of right distal femur (HCC)   Hydrocephalus (HCC)   Bedbound   DNR (do not resuscitate)  Minimally displaced distal femur fracture  Plan: Given the fracture is minimal displaced patient on ambulatory would recommend nonoperative treatment at this point.  Anticipate good healing with conservative management.  Recommend knee immobilizer.  Daily skin checks.  Follow-up in 1 week for repeat x-rays    Joen Laura, MD  Contact  information:   Weekdays 7am-5pm epic message Dr. Blanchie Dessert, or call office for patient follow up: [phone] After hours and holidays please check [email or web address] for group call information for [facility]

## 2022-04-18 NOTE — Progress Notes (Signed)
Report called to [place] at [place], AVS/packet and pt to be transported via [facility] back to facility. D/C in stable condition w all belongings via [facility], family aware.

## 2022-04-18 NOTE — Assessment & Plan Note (Addendum)
-   Suspected due to atelectasis and chronic bedbound status -Continue oxygen at discharge, 2 L.  Can try to attempt and wean as able

## 2022-04-18 NOTE — H&P (Signed)
History and Physical    Maureen Ortiz VOH:[id number] DOB: 05-23-1951 DOA: 04/17/2022  DOS: the patient was seen and examined on 04/17/2022  PCP: Jethro Bastos, MD   Patient coming from: SNF ([facility])  I have personally briefly reviewed patient's old medical records in [facility]  CC: right femur fracture HPI: 71 yo WF with hx of congenital hydrocephalus, now bedbound, lives at [facility] sent to ER for evaluation of a distal right femur fracture.  Apparently this was seen on x-rays performed at the nursing home [facility]) several days ago.  Patient is nonverbal.  She cannot give any description of what happened.  Imaging of her femur showed a comminuted distal femur fracture.  EDP is reached out to orthopedics who says that this is not an operative fracture.  Patient is also bedbound.  She is nonambulatory.  Discussed the case with her daughter Marylene Land cell phone [phone] and also the patient's Sister Harlow Asa phone number [phone] and gave them both an update on the plan of action.  Confirmed with the patient's Sister Harlow Asa that the patient is DNR.  DNR form was not sent with her from the facility.  Given that the patient has a nonoperative fracture and there is no need for hospitalization, [facility] will not admit the patient.  **update. Pt noted to have borderline O2 sats. ABG showed PO2 of 59. CTPA negative for PE. Showed atelectasis. Likely due to pt constantly lying on her back.  ED unwilling to keep pt in ER to have SW arrange for O2 at SNF.   ED Course: xray show distal right femur fracture  Review of Systems:  Review of Systems  Unable to perform ROS: Patient unresponsive    Past Medical History:  Diagnosis Date   Depression    DVT of lower extremity (deep venous thrombosis) (HCC)    Falls frequently    Hard of hearing    Hydrocephalus (HCC)    Hypertension    Incontinent of urine    Seizures (HCC)    Vertigo     Past Surgical  History:  Procedure Laterality Date   BRAIN SURGERY     multiple   BREAST BIOPSY     benign x 2   COCHLEAR IMPLANT     left   CSF SHUNT     KNEE SURGERY     x 3   LAPAROSCOPIC REVISION VENTRICULAR-PERITONEAL (V-P) SHUNT Right 02/09/2015   Procedure: LAPAROSCOPIC REVISION VENTRICULAR-PERITONEAL (V-P) SHUNT;  Surgeon: Tia Alert, MD;  Location: [facility];  Service: Neurosurgery;  Laterality: Right;   paraovarian cyst removal     TUBAL LIGATION     VENTRICULOPERITONEAL SHUNT Right 02/09/2015   Procedure: SHUNT INSERTION VENTRICULAR-PERITONEAL;  Surgeon: Tia Alert, MD;  Location: [facility];  Service: Neurosurgery;  Laterality: Right;     reports that she has never smoked. She has never used smokeless tobacco. She reports that she does not drink alcohol and does not use drugs.  Allergies  Allergen Reactions   Ciprofloxacin     Unknown reaction, listed as an allergy on sheet from [facility]   Codeine Nausea And Vomiting    Family History  Problem Relation Age of Onset   Stroke Mother    Heart disease Father    Heart disease Mother    Colon cancer Neg Hx     Prior to Admission medications   Medication Sig Start Date End Date Taking? Authorizing Provider  buPROPion [facility]  XL) 150 MG 24 hr tablet Take 150 mg by mouth daily.    [provider]  cholecalciferol (VITAMIN D) 1000 UNITS tablet Take 1,000 Units by mouth daily.    [provider]  ferrous sulfate 325 (65 FE) MG EC tablet Take 325 mg by mouth daily.      [provider]  FLUoxetine HCl 60 MG TABS Take 1 tablet by mouth daily.    [provider]  loratadine (CLARITIN) 10 MG tablet Take 10 mg by mouth daily as needed for allergies.    [provider]  LORazepam (ATIVAN) 1 MG tablet Take 1 mg by mouth every 8 (eight) hours.    [provider]  losartan (COZAAR) 50 MG tablet Take 50 mg by mouth daily.    [provider]  LUTEIN PO Take 1 tablet  by mouth daily. 18mg -0.4mg -    [provider]  Multiple Vitamin (MULTI-VITAMIN) tablet Take by mouth.    [provider]  Multiple Vitamins-Minerals (MULTIVITAMINS THER. W/MINERALS) TABS Take 1 tablet by mouth daily.      [provider]    Physical Exam: Vitals:   04/17/22 2230 04/17/22 2254 04/17/22 2345 04/18/22 0045  BP: (!) 140/100  124/80 98/82  Pulse: 89  92 91  Resp: 18  18 17   Temp:  (!) 97.5 F (36.4 C)    TempSrc:  Axillary    SpO2: 98%  100% 100%  Weight:      Height:        Physical Exam Vitals and nursing note reviewed.  Constitutional:      Comments: Cachetic female. No distress  HENT:     Head:     Comments: Hydrocephalus noted Cardiovascular:     Rate and Rhythm: Normal rate and regular rhythm.  Pulmonary:     Effort: Pulmonary effort is normal. No respiratory distress.     Breath sounds: No wheezing or rales.  Abdominal:     General: Bowel sounds are normal. There is no distension.     Tenderness: There is no abdominal tenderness. There is no guarding.  Musculoskeletal:     Comments: Swelling noted right distal femur/right knee. No bruising of knee area  Pt able to move bilateral UE.  Contractures of LE noted(knees, ankles, feet)  Neurological:     Comments: Non verbal      Labs on Admission: I have personally reviewed following labs and imaging studies  CBC: Recent Labs  Lab 04/17/22 1415  WBC 12.9*  NEUTROABS 10.6*  HGB 11.6*  HCT 37.9  MCV 85.2  PLT 198   Basic Metabolic Panel: Recent Labs  Lab 04/17/22 1415  NA 141  K 3.3*  CL 106  CO2 28  GLUCOSE 102*  BUN 17  CREATININE 0.47  CALCIUM 8.2*   GFR: Estimated Creatinine Clearance: 54.1 mL/min (by C-G formula based on SCr of 0.47 mg/dL). Liver Function Tests: No results for input(s): "AST", "ALT", "ALKPHOS", "BILITOT", "PROT", "ALBUMIN" in the last 168 hours. No results for input(s): "LIPASE", "AMYLASE" in the last 168 hours. No results  for input(s): "AMMONIA" in the last 168 hours. Coagulation Profile: No results for input(s): "INR", "PROTIME" in the last 168 hours. Cardiac Enzymes: No results for input(s): "CKTOTAL", "CKMB", "CKMBINDEX", "TROPONINI", "TROPONINIHS" in the last 168 hours. BNP (last 3 results) No results for input(s): "PROBNP" in the last 8760 hours. HbA1C: No results for input(s): "HGBA1C" in the last 72 hours. CBG: No results for input(s): "GLUCAP" in the  last 168 hours. Lipid Profile: No results for input(s): "CHOL", "HDL", "LDLCALC", "TRIG", "CHOLHDL", "LDLDIRECT" in the last 72 hours. Thyroid Function Tests: No results for input(s): "TSH", "T4TOTAL", "FREET4", "T3FREE", "THYROIDAB" in the last 72 hours. Anemia Panel: No results for input(s): "VITAMINB12", "FOLATE", "FERRITIN", "TIBC", "IRON", "RETICCTPCT" in the last 72 hours. Urine analysis:    Component Value Date/Time   COLORURINE YELLOW 04/17/2022 1736   APPEARANCEUR CLOUDY (A) 04/17/2022 1736   LABSPEC 1.025 04/17/2022 1736   PHURINE 6.0 04/17/2022 1736   GLUCOSEU NEGATIVE 04/17/2022 1736   HGBUR MODERATE (A) 04/17/2022 1736   BILIRUBINUR NEGATIVE 04/17/2022 1736   KETONESUR 20 (A) 04/17/2022 1736   PROTEINUR 30 (A) 04/17/2022 1736   UROBILINOGEN 1.0 02/07/2015 1324   NITRITE NEGATIVE 04/17/2022 1736   LEUKOCYTESUR TRACE (A) 04/17/2022 1736    Radiological Exams on Admission: I have personally reviewed images CT Angio Chest PE W and/or Wo Contrast  Result Date: 04/17/2022 CLINICAL DATA:  Pulmonary embolism (PE) suspected, high prob EXAM: CT ANGIOGRAPHY CHEST WITH CONTRAST TECHNIQUE: Multidetector CT imaging of the chest was performed using the standard protocol during bolus administration of intravenous contrast. Multiplanar CT image reconstructions and MIPs were obtained to evaluate the vascular anatomy. RADIATION DOSE REDUCTION: This exam was performed according to the departmental dose-optimization program which includes automated  exposure control, adjustment of the mA and/or kV according to patient size and/or use of iterative reconstruction technique. CONTRAST:  75mL OMNIPAQUE IOHEXOL 350 MG/ML SOLN COMPARISON:  None Available. FINDINGS: Cardiovascular: Satisfactory opacification of the pulmonary arteries to the segmental level. No evidence of pulmonary embolism. Enlarged right atrium. Reflux of contrast into the IVC and hepatic veins.No pericardial disease. Mild atherosclerosis of the thoracic aorta. Mediastinum/Nodes: No lymphadenopathy. The thyroid is atrophic. Large hiatal hernia with intrathoracic stomach. Lungs/Pleura: Left basilar atelectasis. Right basilar consolidation favored to be atelectasis. Elevated right hemidiaphragm. There is mild interlobular septal thickening and ground-glass opacities in the lung apices. Trace right pleural effusion. No pneumothorax. Upper Abdomen: Large hiatal hernia with intrathoracic stomach. No acute abnormality. Musculoskeletal: There are multiple chronic rib injuries bilaterally. There is a chronic L1 compression deformity without evidence of progressive height loss in comparison to prior exam in may 2016. Review of the MIP images confirms the above findings. IMPRESSION: No evidence of pulmonary embolism.  Mild pulmonary edema. Right basilar consolidation favored to be atelectasis. Trace right pleural effusion. Large hiatal hernia with intrathoracic stomach and adjacent left basilar atelectasis. Electronically Signed   By: Caprice RenshawJacob  Kahn M.D.   On: 04/17/2022 19:13   DG Chest 2 View  Result Date: 04/17/2022 CLINICAL DATA:  Hypoxia EXAM: CHEST - 2 VIEW COMPARISON:  Radiographs 08/28/2015 FINDINGS: VP shunt catheter tubing traverses the chest. Calcified tubing traverses the left chest. Normal cardiomediastinal silhouette. Leftward rotation. Large hiatal hernia. Pulmonary vascular congestion and mild interstitial prominence on the right. Small right pleural effusion. Remote right rib fractures. No  acute osseous abnormality. IMPRESSION: Mild pulmonary vascular congestion and interstitial prominence greater on the right. Small right pleural effusion. Findings are nonspecific but may be seen with edema or infection. Electronically Signed   By: Minerva Festeryler  Stutzman M.D.   On: 04/17/2022 18:16   DG Femur Min 2 Views Left  Result Date: 04/17/2022 CLINICAL DATA:  Pain EXAM: LEFT FEMUR 2 VIEWS; RIGHT FEMUR 2 VIEWS COMPARISON:  None Available. FINDINGS: Evaluation is limited due to patient positioning. Right: Comminuted fracture of the distal metaphysis of the right femur. Diffuse demineralization. Surrounding soft tissue edema. Left: No  evidence of displaced fracture or other focal bone lesions. Diffuse demineralization. Soft tissues are unremarkable. IMPRESSION: Comminuted fracture of the distal metaphysis of the right femur. Electronically Signed   By: Allegra Lai M.D.   On: 04/17/2022 16:26   DG Femur Min 2 Views Right  Result Date: 04/17/2022 CLINICAL DATA:  Pain EXAM: LEFT FEMUR 2 VIEWS; RIGHT FEMUR 2 VIEWS COMPARISON:  None Available. FINDINGS: Evaluation is limited due to patient positioning. Right: Comminuted fracture of the distal metaphysis of the right femur. Diffuse demineralization. Surrounding soft tissue edema. Left: No evidence of displaced fracture or other focal bone lesions. Diffuse demineralization. Soft tissues are unremarkable. IMPRESSION: Comminuted fracture of the distal metaphysis of the right femur. Electronically Signed   By: Allegra Lai M.D.   On: 04/17/2022 16:26   DG Knee Complete 4 Views Right  Result Date: 04/17/2022 CLINICAL DATA:  Trauma, pain EXAM: RIGHT KNEE - COMPLETE 4+ VIEW COMPARISON:  None Available. FINDINGS: Comminuted impacted fracture is seen in the distal metaphysis of right femur. There is over riding of fracture fragments along the lateral aspect. There is soft tissue fullness in suprapatellar bursa suggesting moderate effusion. Osteopenia is seen in the  bony structures. Degenerative changes are noted with bony spurs and chondrocalcinosis. Scattered soft tissue calcifications are seen. IMPRESSION: Comminuted fracture is seen in distal metaphysis of right femur. There is lateral displacement of distal major fracture fragment in relation to the shaft. There is over riding of fracture fragments. There is moderate effusion in suprapatellar bursa. Osteopenia and degenerative changes are noted. Electronically Signed   By: Ernie Avena M.D.   On: 04/17/2022 15:29   DG Knee Complete 4 Views Left  Result Date: 04/17/2022 CLINICAL DATA:  Trauma, pain EXAM: LEFT KNEE - COMPLETE 4+ VIEW COMPARISON:  None Available. FINDINGS: No displaced fracture or dislocation is seen. Osteopenia is seen in bony structures. There is no significant effusion in suprapatellar bursa. Degenerative changes are noted with bony spurs and chondrocalcinosis. There are few small scattered soft tissue calcifications. IMPRESSION: No recent fracture or dislocation is seen in left knee. Osteopenia. Degenerative changes are noted. Electronically Signed   By: Ernie Avena M.D.   On: 04/17/2022 15:26   DG Hip Unilat W or Wo Pelvis 2-3 Views Right  Result Date: 04/17/2022 CLINICAL DATA:  Right hip pain with limited weight-bearing. EXAM: DG HIP (WITH OR WITHOUT PELVIS) 2-3V RIGHT COMPARISON:  None Available. FINDINGS: The bones are demineralized. No evidence of acute fracture, dislocation or femoral head osteonecrosis. There are mild degenerative changes of the hips, sacroiliac joints and lower lumbar spine. The soft tissues appear unremarkable. Ventricular peritoneal shunt catheter noted. IMPRESSION: No evidence of acute fracture or dislocation. If the patient has persistent hip pain or inability to bear weight, follow up imaging may be warranted as acute hip fractures can be radiographically occult in the elderly. Electronically Signed   By: Carey Bullocks M.D.   On: 04/17/2022 14:10     EKG: My personal interpretation of EKG shows: sinus tachycardia    Assessment/Plan Principal Problem:   Acute respiratory failure with hypoxia (HCC) Active Problems:   Closed fracture of right distal femur (HCC)   S/P ventricular shunt placement   Hydrocephalus (HCC)   Bedbound   DNR (do not resuscitate)    Assessment and Plan: * Acute respiratory failure with hypoxia (HCC) Observation bed so SW can arrange for oxygen at SNF.   Closed fracture of right distal femur [facility]) Patient remain in a knee  immobilizer right leg until followed up with orthopedics in 1 to 2 weeks following discharge from the ER.  Recommended to EDP that patient be provided prescription opiates for the next 5 days for pain control in with patient care.  DNR (do not resuscitate) Confirmed with the patient's sister and power of attorney Harlow Asa phone number [phone] the patient is a DNR/DNI.  Bedbound Chronic.  Hydrocephalus (HCC) Chronic.  Daughter states patient had congenital hydrocephalus at birth.  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  S/P ventricular shunt placement Stable.    DVT prophylaxis: SCDs Code Status: DNR/DNI(Do NOT Intubate) Family Communication: discussed with pt's dtr angela and pt's POA Mary  Disposition Plan: return to SNF  Consults called: EDP has consulted ortho  Admission status: Observation, Med-Surg   Carollee Herter, DO [facility] 04/18/2022, 1:02 AM

## 2022-04-21 DIAGNOSIS — M80051D Age-related osteoporosis with current pathological fracture, right femur, subsequent encounter for fracture with routine healing: Secondary | ICD-10-CM | POA: Diagnosis not present

## 2022-04-21 DIAGNOSIS — K449 Diaphragmatic hernia without obstruction or gangrene: Secondary | ICD-10-CM | POA: Diagnosis not present

## 2022-04-21 DIAGNOSIS — Z7689 Persons encountering health services in other specified circumstances: Secondary | ICD-10-CM | POA: Diagnosis not present

## 2022-04-21 DIAGNOSIS — J9691 Respiratory failure, unspecified with hypoxia: Secondary | ICD-10-CM | POA: Diagnosis not present

## 2022-04-21 DIAGNOSIS — M6281 Muscle weakness (generalized): Secondary | ICD-10-CM | POA: Diagnosis not present

## 2022-04-21 DIAGNOSIS — R52 Pain, unspecified: Secondary | ICD-10-CM | POA: Diagnosis not present

## 2022-04-21 DIAGNOSIS — R278 Other lack of coordination: Secondary | ICD-10-CM | POA: Diagnosis not present

## 2022-04-22 DIAGNOSIS — M6281 Muscle weakness (generalized): Secondary | ICD-10-CM | POA: Diagnosis not present

## 2022-04-22 DIAGNOSIS — R278 Other lack of coordination: Secondary | ICD-10-CM | POA: Diagnosis not present

## 2022-04-29 DIAGNOSIS — R627 Adult failure to thrive: Secondary | ICD-10-CM | POA: Diagnosis not present

## 2022-04-29 DIAGNOSIS — M80051D Age-related osteoporosis with current pathological fracture, right femur, subsequent encounter for fracture with routine healing: Secondary | ICD-10-CM | POA: Diagnosis not present

## 2022-04-29 DIAGNOSIS — S72401A Unspecified fracture of lower end of right femur, initial encounter for closed fracture: Secondary | ICD-10-CM | POA: Diagnosis not present

## 2022-05-09 DIAGNOSIS — D649 Anemia, unspecified: Secondary | ICD-10-CM | POA: Diagnosis not present

## 2022-05-09 DIAGNOSIS — Z7689 Persons encountering health services in other specified circumstances: Secondary | ICD-10-CM | POA: Diagnosis not present

## 2022-09-30 DIAGNOSIS — U071 COVID-19: Secondary | ICD-10-CM | POA: Diagnosis not present

## 2022-10-24 DIAGNOSIS — Z6821 Body mass index (BMI) 21.0-21.9, adult: Secondary | ICD-10-CM

## 2022-10-24 DIAGNOSIS — I10 Essential (primary) hypertension: Secondary | ICD-10-CM | POA: Diagnosis not present

## 2022-10-24 DIAGNOSIS — M810 Age-related osteoporosis without current pathological fracture: Secondary | ICD-10-CM

## 2022-10-24 DIAGNOSIS — F331 Major depressive disorder, recurrent, moderate: Secondary | ICD-10-CM

## 2022-10-24 DIAGNOSIS — K5900 Constipation, unspecified: Secondary | ICD-10-CM

## 2022-10-24 DIAGNOSIS — R52 Pain, unspecified: Secondary | ICD-10-CM | POA: Diagnosis not present

## 2022-10-24 DIAGNOSIS — L210 Seborrhea capitis: Secondary | ICD-10-CM

## 2022-11-04 DIAGNOSIS — N181 Chronic kidney disease, stage 1: Secondary | ICD-10-CM | POA: Diagnosis not present

## 2022-11-04 DIAGNOSIS — D649 Anemia, unspecified: Secondary | ICD-10-CM | POA: Diagnosis not present

## 2023-06-11 DIAGNOSIS — F32A Depression, unspecified: Secondary | ICD-10-CM | POA: Diagnosis not present

## 2023-06-11 DIAGNOSIS — I10 Essential (primary) hypertension: Secondary | ICD-10-CM | POA: Diagnosis not present

## 2023-06-11 DIAGNOSIS — Q039 Congenital hydrocephalus, unspecified: Secondary | ICD-10-CM

## 2023-06-11 DIAGNOSIS — K5909 Other constipation: Secondary | ICD-10-CM | POA: Diagnosis not present

## 2023-06-11 DIAGNOSIS — I679 Cerebrovascular disease, unspecified: Secondary | ICD-10-CM | POA: Diagnosis not present

## 2023-06-16 DIAGNOSIS — H1033 Unspecified acute conjunctivitis, bilateral: Secondary | ICD-10-CM | POA: Diagnosis not present

## 2023-09-23 DIAGNOSIS — R197 Diarrhea, unspecified: Secondary | ICD-10-CM | POA: Diagnosis not present

## 2023-09-23 DIAGNOSIS — Z7689 Persons encountering health services in other specified circumstances: Secondary | ICD-10-CM | POA: Diagnosis not present

## 2023-09-23 DIAGNOSIS — R110 Nausea: Secondary | ICD-10-CM | POA: Diagnosis not present

## 2023-09-23 DIAGNOSIS — R0682 Tachypnea, not elsewhere classified: Secondary | ICD-10-CM | POA: Diagnosis not present

## 2023-11-09 DIAGNOSIS — H903 Sensorineural hearing loss, bilateral: Secondary | ICD-10-CM | POA: Diagnosis not present

## 2024-01-19 DIAGNOSIS — B351 Tinea unguium: Secondary | ICD-10-CM | POA: Diagnosis not present

## 2024-01-19 DIAGNOSIS — I739 Peripheral vascular disease, unspecified: Secondary | ICD-10-CM | POA: Diagnosis not present

## 2024-01-19 DIAGNOSIS — F325 Major depressive disorder, single episode, in full remission: Secondary | ICD-10-CM | POA: Diagnosis not present

## 2024-01-25 DIAGNOSIS — I10 Essential (primary) hypertension: Secondary | ICD-10-CM | POA: Diagnosis not present

## 2024-01-25 DIAGNOSIS — R52 Pain, unspecified: Secondary | ICD-10-CM | POA: Diagnosis not present

## 2024-01-25 DIAGNOSIS — F339 Major depressive disorder, recurrent, unspecified: Secondary | ICD-10-CM | POA: Diagnosis not present

## 2024-03-18 DIAGNOSIS — I10 Essential (primary) hypertension: Secondary | ICD-10-CM | POA: Diagnosis not present

## 2024-03-18 DIAGNOSIS — K5900 Constipation, unspecified: Secondary | ICD-10-CM | POA: Diagnosis not present

## 2024-03-18 DIAGNOSIS — F329 Major depressive disorder, single episode, unspecified: Secondary | ICD-10-CM | POA: Diagnosis not present

## 2024-05-13 DIAGNOSIS — F339 Major depressive disorder, recurrent, unspecified: Secondary | ICD-10-CM | POA: Diagnosis not present

## 2024-05-13 DIAGNOSIS — I10 Essential (primary) hypertension: Secondary | ICD-10-CM | POA: Diagnosis not present

## 2024-05-13 DIAGNOSIS — K5900 Constipation, unspecified: Secondary | ICD-10-CM | POA: Diagnosis not present

## 2024-05-13 DIAGNOSIS — F39 Unspecified mood [affective] disorder: Secondary | ICD-10-CM | POA: Diagnosis not present

## 2024-06-26 DIAGNOSIS — K5900 Constipation, unspecified: Secondary | ICD-10-CM | POA: Diagnosis not present

## 2024-06-26 DIAGNOSIS — I10 Essential (primary) hypertension: Secondary | ICD-10-CM | POA: Diagnosis not present

## 2024-06-26 DIAGNOSIS — F339 Major depressive disorder, recurrent, unspecified: Secondary | ICD-10-CM | POA: Diagnosis not present

## 2024-06-26 DIAGNOSIS — F419 Anxiety disorder, unspecified: Secondary | ICD-10-CM | POA: Diagnosis not present

## 2024-07-26 DIAGNOSIS — Z515 Encounter for palliative care: Secondary | ICD-10-CM | POA: Diagnosis not present

## 2024-07-26 DIAGNOSIS — F329 Major depressive disorder, single episode, unspecified: Secondary | ICD-10-CM | POA: Diagnosis not present

## 2024-07-26 DIAGNOSIS — I10 Essential (primary) hypertension: Secondary | ICD-10-CM | POA: Diagnosis not present
